# Patient Record
Sex: Female | Born: 1935 | Race: White | Hispanic: No | Marital: Married | Smoking: Never smoker
Health system: Southern US, Community
[De-identification: ages and names within clinical notes are randomized; demographics above are authoritative.]

## PROBLEM LIST (undated history)

## (undated) DIAGNOSIS — M810 Age-related osteoporosis without current pathological fracture: Secondary | ICD-10-CM

## (undated) DIAGNOSIS — E785 Hyperlipidemia, unspecified: Secondary | ICD-10-CM

## (undated) DIAGNOSIS — I519 Heart disease, unspecified: Secondary | ICD-10-CM

## (undated) DIAGNOSIS — I219 Acute myocardial infarction, unspecified: Secondary | ICD-10-CM

## (undated) DIAGNOSIS — F329 Major depressive disorder, single episode, unspecified: Secondary | ICD-10-CM

## (undated) DIAGNOSIS — F32A Depression, unspecified: Secondary | ICD-10-CM

## (undated) DIAGNOSIS — E039 Hypothyroidism, unspecified: Secondary | ICD-10-CM

## (undated) DIAGNOSIS — I10 Essential (primary) hypertension: Secondary | ICD-10-CM

## (undated) DIAGNOSIS — K559 Vascular disorder of intestine, unspecified: Secondary | ICD-10-CM

## (undated) DIAGNOSIS — N6019 Diffuse cystic mastopathy of unspecified breast: Secondary | ICD-10-CM

## (undated) DIAGNOSIS — T7840XA Allergy, unspecified, initial encounter: Secondary | ICD-10-CM

## (undated) HISTORY — DX: Allergy, unspecified, initial encounter: T78.40XA

## (undated) HISTORY — PX: BREAST BIOPSY: SHX20

## (undated) HISTORY — DX: Depression, unspecified: F32.A

## (undated) HISTORY — DX: Age-related osteoporosis without current pathological fracture: M81.0

## (undated) HISTORY — DX: Major depressive disorder, single episode, unspecified: F32.9

## (undated) HISTORY — PX: OTHER SURGICAL HISTORY: SHX169

## (undated) HISTORY — PX: BREAST SURGERY: SHX581

---

## 2004-05-28 ENCOUNTER — Ambulatory Visit: Payer: Self-pay | Admitting: Internal Medicine

## 2005-07-18 ENCOUNTER — Ambulatory Visit: Payer: Self-pay | Admitting: Internal Medicine

## 2006-07-29 ENCOUNTER — Ambulatory Visit: Payer: Self-pay | Admitting: Internal Medicine

## 2007-08-04 ENCOUNTER — Ambulatory Visit: Payer: Self-pay | Admitting: Internal Medicine

## 2008-08-04 ENCOUNTER — Ambulatory Visit: Payer: Self-pay | Admitting: Internal Medicine

## 2008-09-29 ENCOUNTER — Ambulatory Visit: Payer: Self-pay | Admitting: Gastroenterology

## 2009-05-06 DIAGNOSIS — K559 Vascular disorder of intestine, unspecified: Secondary | ICD-10-CM

## 2009-05-06 HISTORY — DX: Vascular disorder of intestine, unspecified: K55.9

## 2009-08-18 ENCOUNTER — Ambulatory Visit: Payer: Self-pay | Admitting: Internal Medicine

## 2011-02-15 ENCOUNTER — Ambulatory Visit: Payer: Self-pay | Admitting: Internal Medicine

## 2011-10-01 ENCOUNTER — Observation Stay: Payer: Self-pay | Admitting: Internal Medicine

## 2011-10-01 LAB — COMPREHENSIVE METABOLIC PANEL
Alkaline Phosphatase: 57 U/L (ref 50–136)
Calcium, Total: 8.8 mg/dL (ref 8.5–10.1)
Chloride: 102 mmol/L (ref 98–107)
EGFR (African American): 54 — ABNORMAL LOW
EGFR (Non-African Amer.): 47 — ABNORMAL LOW
Glucose: 109 mg/dL — ABNORMAL HIGH (ref 65–99)
Osmolality: 283 (ref 275–301)
Potassium: 3.8 mmol/L (ref 3.5–5.1)
Sodium: 140 mmol/L (ref 136–145)

## 2011-10-01 LAB — CBC
HGB: 12.7 g/dL (ref 12.0–16.0)
MCH: 31.8 pg (ref 26.0–34.0)
MCV: 95 fL (ref 80–100)
Platelet: 163 10*3/uL (ref 150–440)
RBC: 4 10*6/uL (ref 3.80–5.20)
RDW: 15 % — ABNORMAL HIGH (ref 11.5–14.5)
WBC: 4.8 10*3/uL (ref 3.6–11.0)

## 2011-10-01 LAB — TROPONIN I
Troponin-I: <0.02 ng/mL
Troponin-I: <0.02 ng/mL

## 2011-10-01 LAB — TSH: Thyroid Stimulating Horm: 5.83 u[IU]/mL — ABNORMAL HIGH

## 2011-10-02 LAB — CBC WITH DIFFERENTIAL/PLATELET
Basophil #: 0 10*3/uL (ref 0.0–0.1)
Eosinophil #: 0.1 10*3/uL (ref 0.0–0.7)
HCT: 34.2 % — ABNORMAL LOW (ref 35.0–47.0)
Lymphocyte %: 34.8 %
MCH: 31.9 pg (ref 26.0–34.0)
MCHC: 33.6 g/dL (ref 32.0–36.0)
MCV: 95 fL (ref 80–100)
Monocyte #: 0.5 x10 3/mm (ref 0.2–0.9)
Monocyte %: 9.7 %
Neutrophil #: 2.5 10*3/uL (ref 1.4–6.5)
Neutrophil %: 52.8 %
Platelet: 152 10*3/uL (ref 150–440)
RBC: 3.61 10*6/uL — ABNORMAL LOW (ref 3.80–5.20)
RDW: 14.8 % — ABNORMAL HIGH (ref 11.5–14.5)
WBC: 4.8 10*3/uL (ref 3.6–11.0)

## 2011-10-02 LAB — TROPONIN I: Troponin-I: <0.02 ng/mL

## 2011-10-02 LAB — LIPID PANEL
Cholesterol: 169 mg/dL (ref 0–200)
Triglycerides: 54 mg/dL (ref 0–200)

## 2011-10-02 LAB — CK-MB: CK-MB: 0.9 ng/mL (ref 0.5–3.6)

## 2011-10-02 LAB — MAGNESIUM: Magnesium: 2.2 mg/dL

## 2012-02-19 ENCOUNTER — Ambulatory Visit: Payer: Self-pay | Admitting: Internal Medicine

## 2013-02-19 ENCOUNTER — Ambulatory Visit: Payer: Self-pay | Admitting: Internal Medicine

## 2014-05-04 ENCOUNTER — Ambulatory Visit: Payer: Self-pay | Admitting: Internal Medicine

## 2014-05-17 ENCOUNTER — Ambulatory Visit: Payer: Self-pay | Admitting: Internal Medicine

## 2014-07-05 ENCOUNTER — Encounter: Admit: 2014-07-05 | Disposition: A | Discharge status: Home / Self Care | Payer: Self-pay

## 2014-08-28 NOTE — Discharge Summary (Signed)
PATIENT NAME:  Alyssa EpsteinSVEE, Ashlon I MR#:  [id number] DATE OF BIRTH:  02/05/1931  DATE OF ADMISSION:  10/01/2011 DATE OF DISCHARGE:  10/02/2011  DIAGNOSES AT TIME OF DISCHARGE: 1. Atypical chest pain, stress test negative.  2. Hypertension.  3. Hyperlipidemia.  4. Hypothyroidism.   CHIEF COMPLAINT: Chest pain.   HISTORY OF PRESENT ILLNESS: Alyssa EpsteinSally Christensen is a 81 year old female with a history of hypertension, hyperlipidemia, and hypothyroidism who presented to the ER complaining of chest pain that increased in intensity. The patient also described jaw pain. She states that she has had similar episodes in the past that were essentially noncardiac type pains.   PAST MEDICAL HISTORY: Significant for hypertension, hyperlipidemia, and hypothyroidism.   PAST SURGICAL HISTORY:  Significant for breast lumpectomy, varicose vein removal, and bunionectomy.   PHYSICAL EXAMINATION: VITAL SIGNS:  Pulse 71, temperature 97, respirations 18, blood pressure 155/70, oxygen saturation 96% on room air. GENERAL: She was alert and oriented and not in distress. HEART: S1, S2. LUNGS: Clear to auscultation. ABDOMEN: Soft, nontender.  EXTREMITIES: No edema. NEUROLOGIC: Nonfocal.   LABORATORY, DIAGNOSTIC, AND RADIOLOGICAL DATA: EKG shows sinus rhythm with PACs, possible left atrial enlargement. No ST elevation or depression noted. Cardiac enzymes were negative. The patient also underwent a cardiac stress test that was essentially normal. Troponin was negative. A1c was 5.8. Magnesium was 2.2. During her stay in the hospital, the patient remained stable. She did not have any further episodes and was reassured that her stress test was essentially negative. She was discharged in stable condition on the following medications.    DISCHARGE MEDICATIONS:  1. Aspirin 81 mg a day.  2. Levothyroxine 25 mcg a day.  3. Metoprolol tartrate 50 mg b.i.d.  4. Multivitamin 1 tablet a day.  5. Simvastatin 20 mg once a day that she takes  on  Mondays, Wednesdays, and Fridays. 6. Losartan 25 mg once a day.   FOLLOWUP: The patient is advised a low-sodium diet and followup with me, Dr. Marcello FennelHande, in 1 to 2 weeks' time    ____________________________ Barbette ReichmannVishwanath Remberto Lienhard, MD vh:bjt D: 10/04/2011 16:54:57 ET T: 10/05/2011 12:25:14 ET JOB#: [id number]  cc: Barbette ReichmannVishwanath Marlos Carmen, MD, <Dictator> Barbette ReichmannVISHWANATH Janeene Sand MD ELECTRONICALLY SIGNED 10/17/2011 13:01

## 2014-08-28 NOTE — H&P (Signed)
PATIENT NAME:  Alyssa Christensen, Alyssa Christensen I MR#:  [id number] DATE OF BIRTH:  08-05-1930  DATE OF ADMISSION:  10/01/2011  REFERRING PHYSICIAN: Dr. Mayford Knife  PRIMARY CARE PHYSICIAN: Barbette Reichmann, MD  CHIEF COMPLAINT: Chest pain.  HISTORY OF PRESENT ILLNESS: The patient is a 81 year old Caucasian female with history of hypertension, hyperlipidemia, and hypothyroidism who presents with the above chief complaint. The patient states that she has had ongoing chest pain for multiple years, over a decade. More recently the chest pain has increased in intensity and has been bothering her more often. The pain is on the left side of the chest under the breast with usually no radiation. It lasts seconds. It often recurs in minutes. She could have several episodes of this daily without any associated symptoms of nausea, vomiting, diaphoresis, or radiation. Overnight there was a change in this as the chest pain was felt radiating to the jaw and she also complained of some jaw pressure.  Besides that is she had no other symptoms. She went back to sleep and woke up this morning and again had similar pain and presented to the hospital. Currently she is chest pain free. She has negative troponin and the hospitalist service was contacted for further evaluation and management.   PAST MEDICAL HISTORY:  1. Hypertension.  2. Hyperlipidemia.  3. Hypothyroidism.   PAST SURGICAL HISTORY:  1. Bunionectomy. 2. Varicose vein removal. 3. Breast lumpectomies.   ALLERGIES: Codeine, Valium, and shellfish is listed, however, she denies having any GI distress any further with shellfish. Penicillin causes thrombocytopenia. Lisinopril causes cough.   FAMILY HISTORY: Bone cancers in several first degree relatives, also family history of hypertension.   SOCIAL HISTORY: No tobacco use history. She occasionally drinks alcohol. No other drug use.   REVIEW OF SYSTEMS: CONSTITUTIONAL: No fever, fatigue or weakness. EYES: No blurry vision or  double vision. ENT: No tinnitus or hearing loss. No snoring. RESPIRATORY: The patient has a dry cough after starting lisinopril. No wheezing. She has chronic dyspnea on exertion. No asthma or painful respiration. CARDIOVASCULAR: Chest pain as above. No orthopnea. Occasional lower extremity edema after prolonged standing. No arrhythmia or palpitations. GI: No nausea, vomiting, diarrhea, abdominal pain, or dark stools. GENITOURINARY: The patient complains of bladder prolapse and some increased frequency with that. No dysuria. ENDOCRINE: No polyuria or nocturia. Hypothyroid state. HEME/LYMPH: No anemia or easy bruising. SKIN: No rashes. MUSCULOSKELETAL: No gout. NEURO: Denies history of cerebrovascular accident, transient ischemic attack, numbness or weakness. PSYCHIATRIC: Denies anxiety or insomnia.   PHYSICAL EXAMINATION:   VITAL SIGNS: Temperature on arrival 97, pulse rate 71, respiratory rate 18, blood pressure on arrival 155/70 and last one 121/54, and oxygen saturation 96% on room air.   GENERAL: The patient is an awake, alert, and oriented Caucasian female in no obvious distress, sitting in bed, talking in full sentences.   HEENT: Normocephalic, atraumatic. Pupils are equal and reactive. Anicteric sclerae. Moist mucous membranes.   NECK: Supple. No thyroid tenderness. No JVD.   HEART: S1 and S2 regular rate and rhythm. No murmurs, rubs, or gallops appreciated.   LUNGS: Clear to auscultation without wheezing, rhonchi, or rales.   ABDOMEN: Soft, nontender, and nondistended. Positive bowel sounds in all quadrants. No organomegaly noted.   EXTREMITIES: No significant lower extremity edema.   SKIN: There are some varicose veins in the lower extremities, otherwise warm. No clubbing or cyanosis.  NEURO: Cranial nerves II through XII grossly intact. Strength 5/5 in all extremities. Sensation is intact to light touch.  PSYCH: Awake, alert, and oriented x3. Pleasant and  cooperative.  LABS/STUDIES: BNP 291. Glucose 109, BUN 22, creatinine 1.12, and calcium 8.1. LFTs within normal limits. Troponin negative x1. WBC 4.8, hemoglobin 12.7, hematocrit 38.1, and platelets 163.   EKG: Sinus rhythm with premature atrial complex, possible left atrial enlargement, rate is 74. No QRS, QTc, or PR interval prolongation. No acute ST elevations or depressions. I do not appreciate any T wave inversions. There is some flattening of T waves in III.  Chest x-ray, one view: No acute changes are identified.   ASSESSMENT AND PLAN: We have a 81 year old Caucasian female with hypertension, hyperlipidemia, and hypothyroidism with ongoing chest pain with mostly atypical features with progressive for chest pain more recently.   In regards to her chest pain, we would admit the patient to the hospital for observation. The patient has mostly atypical symptoms, but with ongoing nature and more recent progression, given age and comorbidities, would rule out acute coronary syndrome as a possibility. The patient has unstable angina. We would resume the aspirin and the beta blocker and add nitro paste. I would check an echocardiogram and if the patient rules out for acute coronary syndrome order a stress test in the morning. We would trend the troponins as well as CK-MB. There is no acute ST changes on the EKG. I would check lipids and place the patient on remote telemetry.  In regards to high blood pressure, we would continue the beta blocker; however, I would discontinue the lisinopril as the patient states she developed a dry hacking cough after initiation of this medicine. I would start losartan, low dose, and this could be titrated up as needed.  In regards to hypothyroidism, we would check a TSH and resume the Synthroid.   CODE STATUS: FULL CODE.   TOTAL TIME SPENT: 40 minutes. ____________________________ Krystal EatonShayiq Vardaan Depascale, MD sa:slb D: 10/01/2011 12:36:51 ET T: 10/01/2011 13:06:01  ET JOB#: [id number]  cc: Krystal EatonShayiq Trichelle Lehan, MD, <Dictator> Barbette ReichmannVishwanath Hande, MD Krystal EatonSHAYIQ Valdez Brannan MD ELECTRONICALLY SIGNED 10/11/2011 12:38

## 2015-01-26 ENCOUNTER — Other Ambulatory Visit: Payer: Self-pay | Admitting: Obstetrics and Gynecology

## 2015-01-26 DIAGNOSIS — N6001 Solitary cyst of right breast: Secondary | ICD-10-CM

## 2015-02-01 ENCOUNTER — Ambulatory Visit: Payer: Medicare Other | Attending: Obstetrics and Gynecology | Admitting: Physical Therapy

## 2015-02-01 ENCOUNTER — Encounter: Payer: Self-pay | Admitting: Physical Therapy

## 2015-02-01 VITALS — BP 110/68

## 2015-02-01 DIAGNOSIS — R279 Unspecified lack of coordination: Secondary | ICD-10-CM | POA: Insufficient documentation

## 2015-02-01 DIAGNOSIS — R269 Unspecified abnormalities of gait and mobility: Secondary | ICD-10-CM | POA: Diagnosis present

## 2015-02-02 ENCOUNTER — Other Ambulatory Visit: Payer: Medicare Other

## 2015-02-02 ENCOUNTER — Ambulatory Visit: Payer: Medicare Other

## 2015-02-02 NOTE — Therapy (Signed)
[facility] MAIN [facility] SERVICES [address] [place], [place], [address] Phone: [phone]   Fax:  [phone]  Physical Therapy Evaluation  Patient Details  Name: Alyssa Christensen MRN: [id number] Date of Birth: 12/04/1930 Referring Provider:  Christeen Douglas, MD  Encounter Date: 02/01/2015      PT End of Session - 02/02/15 1030    Visit Number 1   Number of Visits 12   Date for PT Re-Evaluation 04/26/15   Authorization Type G-code 10th visit   PT Start Time 1005   PT Stop Time 1110   PT Time Calculation (min) 65 min   Activity Tolerance Patient tolerated treatment well;No increased pain   Behavior During Therapy [facility] for tasks assessed/performed      Past Medical History  Diagnosis Date  . Depression   . CHF (congestive heart failure)   . Osteoporosis   . Allergy     Past Surgical History  Procedure Laterality Date  . Breast surgery      "to remove nodules that turned out to be benign"  . Foot surgeries      bilaterally to remove bunions  . Varicose vein "stripped"      pt wears compression stocking now    Filed Vitals:   02/01/15 1027  BP: 110/68    Visit Diagnosis:  Lack of coordination  Abnormality of gait      Subjective Assessment - 02/02/15 0941    Subjective Pt reports her bladder dropped for atleast a decade and now it has become "annoying" to her. Pt has to "put it back in" after urination. Pt experience leakage when pt has waited too long to go to urination, or not have felt the urge. Pt wears a pantyliner as a result, and a thicker pad for the night.  Bowel movements occur daily without straining although pt admits she had strained over the years.  Pt reported she enjoys walking 2 miles/ day  but she has not  resumed her regular routine due to caring for her terminally ill husban (pt appeared tearful). Pt also feels she has not  tended to her health and would like to lose weight.    Pertinent History Hx of 4  vaginal deliveries with episiotomies, bunion surgeries, CHF   Currently in Pain? No/denies            [facility] PT Assessment - 02/02/15 0945    Assessment   Medical Diagnosis pelvic pain   Precautions   Precautions None   Restrictions   Weight Bearing Restrictions No   Home Environment   Living Environment Private residence   Prior Function   Level of Independence Independent   Observation/Other Assessments   Other Surveys  --  PFDI 39%    Coordination   Gross Motor Movements are Fluid and Coordinated --  abdominal straining w/ inhalation   Bed Mobility   Supine to Sit --  crunch technique: cued for log roll    Ambulation/Gait   Gait Comments  lateral trunk lean w/ L SLS, decreased stance on R                  Pelvic Floor Special Questions - 02/02/15 1013    Diastasis Recti below sternum 2 fingers, above umbilicus 1.5 fingers   Prolapse Anterior Wall  visible within introitus in hooklying   Pelvic Floor Internal Exam --  pt verbally consented without contraindications   Exam Type Vaginal   Palpation pillow under hips:  bladder not visible    Strength --  deferred testing strength due to poor coordination   Biofeedback able to coordinate pelvic floor and breathing with moderate cuing (decreased abdominal straining, chest breathing)           [facility] Adult PT Treatment/Exercise - 17-Feb-2015 0945    Posture/Postural Control   Posture Comments limited diaphragmatic excursion and depression   Self-Care   Other Self-Care Comments  educated on pastoral care/counselling for stressful times 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 but pt declined, educated about POC, goals, deep core mechanics    Neuro Re-ed    Neuro Re-ed Details  proper deep core coordination with bowel movements, less effort, decreased chest breathing  log rolling                PT Education - 17-Feb-2015 1030    Education provided Yes   Education Details HEP,POC, Goals,  anatomy/physiology, ways to decrease downward pressure on pelvic floor / abdominal mm   Person(s) Educated Patient   Methods Explanation;Demonstration;Tactile cues;Verbal cues;Handout   Comprehension Verbalized understanding;Returned demonstration             PT Long Term Goals - 17-Feb-2015 1035    PT LONG TERM GOAL #1   Title Pt will decrease her PFDI score from 39% to < 30% in order to demo improved urogenital function and improve QOL.   Time 12   Period Weeks   Status New   PT LONG TERM GOAL #2   Title Pt will demo proper diaphragmatic breathing with excursion on inhalation (less abdominal straining) and ribcage depression on exhalation in order to progress towards pelvic floor strengthening programs.    Time 12   Period Weeks   Status New   PT LONG TERM GOAL #3   Title Pt will demo decreased lateral trunk sway on L STS in order to improve gait and decrease risk for falls.    Time 12   Period Weeks   Status New               Plan - 02/17/2015 1032    Clinical Impression Statement Pt is an 81 yo old female whose S & Sx are consistent with abnormal positioning of bladder, poor coordination of diaphragm, abdominals, and pelvic floor, and poor body mechanics that poses downward pressure on pelvic floor, and gait deviations. These deficits affect her gait and QOL.Marland Kitchen    Pt will benefit from skilled therapeutic intervention in order to improve on the following deficits Abnormal gait;Decreased activity tolerance;Decreased balance;Decreased mobility;Decreased strength;Impaired sensation;Postural dysfunction;Improper body mechanics;Impaired flexibility;Decreased scar mobility;Impaired perceived functional ability;Pain;Decreased endurance;Decreased coordination;Decreased range of motion;Increased fascial restrictions;Difficulty walking;Decreased safety awareness;Increased muscle spasms   Rehab Potential Good   PT Frequency 1x / week   PT Duration 12 weeks   PT Treatment/Interventions  ADLs/Self Care Home Management;Moist Heat;Cryotherapy;Electrical Stimulation;Iontophoresis /ml Dexamethasone;Aquatic Therapy;Biofeedback;Traction;Balance training;Neuromuscular re-education;Patient/family education;Therapeutic activities;Therapeutic exercise;Functional mobility training;Stair training;Gait training;Taping;Energy conservation;Dry needling;Scar mobilization;Manual techniques;Compression bandaging   PT Next Visit Plan assess feet, balance   Consulted and Agree with Plan of Care Patient          G-Codes - 02/17/2015 1043    Functional Assessment Tool Used PFDI 39%   Functional Limitation Self care   Self Care Current Status (Z6109) At least 20 percent but less than 40 percent impaired, limited or restricted   Self Care Goal Status (U0454) At least 1 percent but less than 20 percent impaired, limited or restricted  Problem List There are no active problems to display for this patient.   Mariane Masters  ,PT, DPT, E-RYT  02/02/2015, 10:46 AM  [facility] MAIN [facility] SERVICES [address] [place], [place], [address] Phone: [phone]   Fax:  [phone]

## 2015-02-02 NOTE — Patient Instructions (Signed)
                                               Preserve the function of your pelvic floor, abdomen, and back.              Avoid decreased straining of abdominal/pelvic floor muscles with less              slouching,  holding your breath with lifting/bowel movements)                                                     FUNCTIONAL POSTURES        

## 2015-02-03 ENCOUNTER — Ambulatory Visit
Admission: RE | Admit: 2015-02-03 | Discharge: 2015-02-03 | Disposition: A | Discharge status: Home / Self Care | Payer: Medicare Other | Source: Ambulatory Visit | Attending: Obstetrics and Gynecology | Admitting: Obstetrics and Gynecology

## 2015-02-03 ENCOUNTER — Other Ambulatory Visit: Payer: Self-pay | Admitting: Obstetrics and Gynecology

## 2015-02-03 DIAGNOSIS — N6001 Solitary cyst of right breast: Secondary | ICD-10-CM

## 2015-02-06 ENCOUNTER — Ambulatory Visit: Payer: Medicare Other | Attending: Obstetrics and Gynecology | Admitting: Physical Therapy

## 2015-02-06 ENCOUNTER — Other Ambulatory Visit: Payer: Self-pay | Admitting: Obstetrics and Gynecology

## 2015-02-06 DIAGNOSIS — R269 Unspecified abnormalities of gait and mobility: Secondary | ICD-10-CM | POA: Diagnosis present

## 2015-02-06 DIAGNOSIS — R279 Unspecified lack of coordination: Secondary | ICD-10-CM | POA: Insufficient documentation

## 2015-02-06 DIAGNOSIS — N6310 Unspecified lump in the right breast, unspecified quadrant: Secondary | ICD-10-CM

## 2015-02-06 NOTE — Therapy (Signed)
[facility] MAIN [facility] SERVICES [address] [place], [place], [address] Phone: [phone]   Fax:  [phone]  Physical Therapy Treatment  Patient Details  Name: Alyssa Christensen MRN: [id number] Date of Birth: August 22, 1930 Referring Provider:  Christeen Douglas, MD  Encounter Date: 02/06/2015      PT End of Session - 02/06/15 1424    Visit Number 2   Number of Visits 12   Date for PT Re-Evaluation 04/26/15   Authorization Type G-code 10th visit   PT Start Time 1305   PT Stop Time 1407   PT Time Calculation (min) 62 min   Activity Tolerance Patient tolerated treatment well;No increased pain   Behavior During Therapy [facility] for tasks assessed/performed      Past Medical History  Diagnosis Date  . Depression   . CHF (congestive heart failure)   . Osteoporosis   . Allergy     Past Surgical History  Procedure Laterality Date  . Breast surgery      "to remove nodules that turned out to be benign"  . Foot surgeries      bilaterally to remove bunions  . Varicose vein "stripped"      pt wears compression stocking now  . Breast biopsy Bilateral     neg    There were no vitals filed for this visit.  Visit Diagnosis:  Lack of coordination  Abnormality of gait      Subjective Assessment - 02/06/15 1308    Subjective Pt inquired about performing sit-ups and leg raises, stating she used to do them for 25 years to keep her weight down and to get a flatter belly. Pt practiced HEP.    Pertinent History Hx of 4 vaginal deliveries with episiotomies, bunion surgeries, CHF            [facility] PT Assessment - 02/06/15 1321    Posture/Postural Control   Posture Comments improved diaphragmatic excursion and depression of ribcage   Bed Mobility   Supine to Sit --  cued for log rolling sit > sidelying                   Pelvic Floor Special Questions - 02/06/15 1320    Diastasis Recti below sternum  no finger, above umbilicus 1  fingers   Exam Type Vaginal   Palpation pillow under hips: bladder not visible    Strength fair squeeze, definite lift   Strength # of reps 5   Strength # of seconds 5   Biofeedback cuing for decreased abdominal straining downward on pelvic floor on inhalation and for applying decreased effort with contractions to avoid use of adductor mm           [facility] Adult PT Treatment/Exercise - 02/06/15 1321    Ambulation/Gait   Gait Comments 0.88 m/s   toe-in bilaterally   Self-Care   Other Self-Care Comments  educated to avoid sit -ups/leg raises and implications on worsening prolapse, explained the progression of deep core strengthening towards functional core stability such floor to rise which she stated she has difficulty    Neuro Re-ed    Neuro Re-ed Details  dynamic stabilization 1-2, cuing for pelvic stability                PT Education - 02/06/15 1424    Education provided Yes   Education Details HEP   Person(s) Educated Patient   Methods Explanation;Demonstration;Tactile cues;Verbal cues;Handout   Comprehension Returned demonstration;Verbalized understanding  PT Long Term Goals - 02/06/15 1311    PT LONG TERM GOAL #1   Title Pt will decrease her PFDI score from 39% to < 30% in order to demo improved urogenital function and improve QOL.   Time 12   Period Weeks   Status New   PT LONG TERM GOAL #2   Title Pt will demo proper diaphragmatic breathing with excursion on inhalation (less abdominal straining) and ribcage depression on exhalation in order to progress towards pelvic floor strengthening programs.    Time 12   Period Weeks   Status New   PT LONG TERM GOAL #3   Title Pt will demo decreased lateral trunk sway on L SLS in order to improve gait and decrease risk for falls.    Time 12   Period Weeks   Status New   PT LONG TERM GOAL #4   Title Pt will improve her gait speed from 0.88 to > 1.2m/s in order to decrease her risk for falls and to  ambulate in the community.    Time 12   Period Weeks   Status New               Plan - 02/06/15 1426    Clinical Impression Statement Pt demo'd significant improvement with diaphragmatic/ pelvic floor coordination with minor cuing for abominal straining during pelvic floor strengthening exercises and deep core strengthening Level 1-2. Pt presented today with improved DRA. Assess LE at next session.    Pt will benefit from skilled therapeutic intervention in order to improve on the following deficits Abnormal gait;Decreased activity tolerance;Decreased balance;Decreased mobility;Decreased strength;Impaired sensation;Postural dysfunction;Improper body mechanics;Impaired flexibility;Decreased scar mobility;Impaired perceived functional ability;Pain;Decreased endurance;Decreased coordination;Decreased range of motion;Increased fascial restricitons;Difficulty walking;Decreased safety awareness;Increased muscle spasms   Rehab Potential Good   PT Frequency 1x / week   PT Duration 12 weeks   PT Treatment/Interventions ADLs/Self Care Home Management;Moist Heat;Cryotherapy;Electrical Stimulation;Iontophoresis /ml Dexamethasone;Aquatic Therapy;Biofeedback;Traction;Balance training;Neuromuscular re-education;Patient/family education;Therapeutic activities;Therapeutic exercise;Functional mobility training;Stair training;Gait training;Taping;Energy conservation;Dry needling;Scar mobilization;Manual techniques;Compression bandaging   PT Next Visit Plan assess feet, balance   Consulted and Agree with Plan of Care Patient        Problem List There are no active problems to display for this patient.   Mariane Masters ,PT, DPT, E-RYT  02/06/2015, 2:33 PM  [place] [facility] MAIN [facility] SERVICES [address] [place], [place], [address] Phone: [phone]   Fax:  [phone]

## 2015-02-06 NOTE — Patient Instructions (Addendum)
  Avoid crunches and leg lifts.     PELVIC FLOOR / KEGEL EXERCISES   Pelvic floor/ Kegel exercises are used to strengthen the muscles in the base of your pelvis that are responsible for supporting your pelvic organs and preventing urine/feces leakage. Based on your therapist's recommendations, they can be performed while standing, sitting, or lying down. Imagine pelvic floor area as a diamond with pelvic landmarks: top =pubic bone, bottom tip=tailbone, sides=sitting bones (ischial tuberosities).    Make yourself aware of this muscle group by using these cues while coordinating your breath:  Inhale, feel pelvic floor diamond area lower like hammock towards your feet and ribcage/belly expanding. Pause. Let the exhale naturally and feel your belly sink, abdominal muscles hugging in around you and you may notice the pelvic diamond draws upward towards your head forming a umbrella shape. Give a squeeze during the exhalation like you are stopping the flow of urine. If you are squeezing the buttock muscles, try to give 50% less effort.   Common Errors:  Breath holding: If you are holding your breath, you may be bearing down against your bladder instead of pulling it up. If you belly bulges up while you are squeezing, you are holding your breath. Be sure to breathe gently in and out while exercising. Counting out loud may help you avoid holding your breath.  Accessory muscle use: You should not see or feel other muscle movement when performing pelvic floor exercises. When done properly, no one can tell that you are performing the exercises. Keep the buttocks, belly and inner thighs relaxed.  Overdoing it: Your muscles can fatigue and stop working for you if you over-exercise. You may actually leak more or feel soreness at the lower abdomen or rectum.  YOUR HOME EXERCISE PROGRAM  LONG HOLDS: Position: on back with pillow under hips  Inhale and then exhale. Then squeeze the muscle and count aloud for  5 seconds. Rest with three long breaths. (Be sure to let belly sink in with exhales and not push outward)  Perform 5 repetitions, 3 times/day  SHORT HOLDS: Position: on back, with pillow under hips  Inhale and then exhale. Then squeeze the muscle.  (Be sure to let belly sink in with exhales and not push outward)  Perform 5 repetitions, 5  Times/day                      DECREASE DOWNWARD PRESSURE ON  YOUR PELVIC FLOOR, ABDOMINAL, LOW BACK MUSCLES       PRESERVE YOUR PELVIC HEALTH LONG-TERM   ** SQUEEZE pelvic floor BEFORE YOUR SNEEZE, COUGH, LAUGH   ** EXHALE BEFORE YOU RISE AGAINST GRAVITY (lifting, sit to stand, from squat to stand)   ** LOG ROLL OUT OF BED INSTEAD OF CRUNCH/SIT-UP

## 2015-02-14 ENCOUNTER — Ambulatory Visit: Payer: Medicare Other | Admitting: Physical Therapy

## 2015-02-14 DIAGNOSIS — R279 Unspecified lack of coordination: Secondary | ICD-10-CM | POA: Diagnosis not present

## 2015-02-14 DIAGNOSIS — R269 Unspecified abnormalities of gait and mobility: Secondary | ICD-10-CM

## 2015-02-15 NOTE — Therapy (Signed)
[facility]IN [facility] [address] [place], [place], [address] Phone: [phone]   Fax:  [phone]  Physical Therapy Treatment  Patient Details  Name: Alyssa HintSally I Christensen MRN: [id number] Date of Birth: 1930-12-02 Referring Provider:  Christeen DouglasBeasley, Bethany, MD  Encounter Date: 02/14/2015      PT End of Session - 02/15/15 2144    Visit Number 3   Number of Visits 12   Date for PT Re-Evaluation 04/26/15   Authorization Type G-code 10th visit   PT Start Time 1430   PT Stop Time 1530   PT Time Calculation (min) 60 min   Activity Tolerance Patient tolerated treatment well;No increased pain   Behavior [facility] for tasks assessed/performed      Past Medical History  Diagnosis Date  . Depression   . CHF (congestive heart failure)   . Osteoporosis   . Allergy     Past Surgical History  Procedure Laterality Date  . Breast surgery      "to remove nodules that turned out to be benign"  . Foot surgeries      bilaterally to remove bunions  . Varicose vein "stripped"      pt wears compression stocking now  . Breast biopsy Bilateral     neg    There were no vitals filed for this visit.  Visit Diagnosis:  Lack of coordination  Abnormality of gait      Subjective Assessment - 02/15/15 2141    Subjective Pt has been doing her HEP but not as much she'd lke. Pt returned to walking with her friends. Pt continues to be busy with taking her husband to medical appointments.    Pertinent History Hx of 4 vaginal deliveries with episiotomies, bunion surgeries, CHF                      Pelvic Floor Special Questions - 02/15/15 2141    Exam Type Vaginal   Palpation pillow under hips: bladder not visible    Strength fair squeeze, definite lift   Strength # of reps 5   Strength # of seconds 5   Biofeedback coordination was correct but required cuing for counting aloud and the importance of keeping count on reps and rest  breaks            [facility] Adult PT Treatment/Exercise - 02/15/15 0001    Therapeutic Activites    ADL's sweeping, vaccuuming with side stepping and semi tandem weight shifting, pt demo'd correctly with tools in hand   Lifting 10 reps of mini squats by counter, 15# box with proper mechanics, 5 reps with 1 rep carrying 30 ft  required alignment cuing and no breathholding                PT Education - 02/15/15 2144    Education provided Yes   Education Details HEP   Person(s) Educated Patient   Methods Explanation;Demonstration;Tactile cues;Verbal cues   Comprehension Returned demonstration;Verbalized understanding             PT Long Term Goals - 02/06/15 1311    PT LONG TERM GOAL #1   Title Pt will decrease her PFDI score from 39% to < 30% in order to demo improved urogenital function and improve QOL.   Time 12   Period Weeks   Status New   PT LONG TERM GOAL #2   Title Pt will demo proper diaphragmatic breathing with excursion on inhalation (  less abdominal straining) and ribcage depression on exhalation in order to progress towards pelvic floor strengthening programs.    Time 12   Period Weeks   Status New   PT LONG TERM GOAL #3   Title Pt will demo decreased lateral trunk sway on L SLS in order to improve gait and decrease risk for falls.    Time 12   Period Weeks   Status New   PT LONG TERM GOAL #4   Title Pt will improve her gait speed from 0.88 to > 1.89m/s in order to decrease her risk for falls and to ambulate in the community.    Time 12   Period Weeks   Status New               Plan - 02/15/15 2155    Clinical Impression Statement Pt is progressing well towards her goals with demonstration of proper pelvic floor contraction with less chest breathing and use of accessory mm.Initiated functional exercises to increase compliance with H.  Pt will skip next week's session due to family being in town. Pt was educated on maintaining HEP.  EP    Pt  will benefit from skilled therapeutic intervention in order to improve on the following deficits Abnormal gait;Decreased activity tolerance;Decreased balance;Decreased mobility;Decreased strength;Impaired sensation;Postural dysfunction;Improper body mechanics;Impaired flexibility;Decreased scar mobility;Impaired perceived functional ability;Pain;Decreased endurance;Decreased coordination;Decreased range of motion;Increased fascial restricitons;Difficulty walking;Decreased safety awareness;Increased muscle spasms   Rehab Potential Good   PT Frequency 1x / week   PT Duration 12 weeks   PT Treatment/Interventions ADLs/Self Care Home Management;Moist Heat;Cryotherapy;Electrical Stimulation;Iontophoresis /ml Dexamethasone;Aquatic Therapy;Biofeedback;Traction;Balance training;Neuromuscular re-education;Patient/family education;Therapeutic activities;Therapeutic exercise;Functional mobility training;Stair training;Gait training;Taping;Energy conservation;Dry needling;Scar mobilization;Manual techniques;Compression bandaging   PT Next Visit Plan assess feet, balance   Consulted and Agree with Plan of Care Patient        Problem List There are no active problems to display for this patient.   Mariane Masters  ,PT, DPT, E-RYT  02/15/2015, 10:00 PM  [facility] MAIN [facility] SERVICES [address] [place], [place], [address] Phone: [phone]   Fax:  [phone]

## 2015-02-28 ENCOUNTER — Encounter: Payer: BLUE CROSS/BLUE SHIELD | Admitting: Physical Therapy

## 2015-03-02 ENCOUNTER — Ambulatory Visit: Payer: Medicare Other | Admitting: Physical Therapy

## 2015-03-02 DIAGNOSIS — R269 Unspecified abnormalities of gait and mobility: Secondary | ICD-10-CM

## 2015-03-02 DIAGNOSIS — R279 Unspecified lack of coordination: Secondary | ICD-10-CM

## 2015-03-02 NOTE — Patient Instructions (Addendum)
Exhale when rising from chair   Exhale with counter push ups with feet hip width apart and sway of the back   Pelvic floor 10 sec holds, 5 reps, 5 x day for 2 weeks, then progress to 7 reps for another 2 weeks...  Diaphragmatic breathing in standing (less chest breathing), feel the upward life of pelvic floor muscles and hugging of belly muscles on exhale   Follow -up with Dr. Dalbert GarnetBeasley with questions about pessary use and PT for progression of exercises   Toileting posture with feet flat on stool not with heels raised

## 2015-03-03 NOTE — Therapy (Signed)
[place] MAIN [facility] SERVICES [address] [place], [place], [address] Phone: [phone]   Fax:  [phone]  Physical Therapy Treatment [place] Summary  Patient Details  Name: Alyssa Christensen MRN: [id number] Date of Birth: [birth year] Referring Provider: Leafy Ro  Encounter Date: 03/02/2015      PT End of Session - 03/02/15 1456    Visit Number 4   Number of Visits 12   Date for PT Re-Evaluation 04/26/15   Authorization Type G-code 10th visit   PT Start Time 5102   PT Stop Time 1410   PT Time Calculation (min) 65 min   Activity Tolerance Patient tolerated treatment well;No increased pain   Behavior During Therapy [facility] for tasks assessed/performed      Past Medical History  Diagnosis Date  . Depression   . CHF (congestive heart failure)   . Osteoporosis   . Allergy     Past Surgical History  Procedure Laterality Date  . Breast surgery      "to remove nodules that turned out to be benign"  . Foot surgeries      bilaterally to remove bunions  . Varicose vein "stripped"      pt wears compression stocking now  . Breast biopsy Bilateral     neg    There were no vitals filed for this visit.  Visit Diagnosis:  Lack of coordination  Abnormality of gait      Subjective Assessment - 03/02/15 1308    Subjective Pt was not able to perform as much as she would have liked. Pt feels she is getting busy with her schedule and feels she know she needs to practice the exercises. Pt would like to return to previous self selected  exercises . Bowel movements are much easier but pt reported she has her heels off the floor and leans herself back .    Pertinent History Hx of 4 vaginal deliveries with episiotomies, bunion surgeries, CHF   Currently in Pain? No/denies            [facility] PT Assessment - 03/03/15 1858    Assessment   Medical Diagnosis pelvic pain   Referring Provider Leafy Ro   Observation/Other Assessments   Other Surveys   --  PFDI 42%   Sit to Stand   Comments breath holding, slight genu valgus                  Pelvic Floor Special Questions - 03/03/15 1859    Diastasis Recti no separation noted    Prolapse --  no longer visible but visible in standing   Pelvic Floor Internal Exam --  pt verbally consented without contraindications   Exam Type Vaginal   Palpation no longer pillow needed   Strength fair squeeze, definite lift   Strength # of reps 5   Strength # of seconds 10   Biofeedback coordination was correct but required cuing for counting aloud and the importance of keeping count on reps and rest breaks            [facility] Adult PT Treatment/Exercise - 03/03/15 1858    Ambulation/Gait   Gait Comments 1.11 m/s   Self-Care   Other Self-Care Comments  D/C, encouragement on her progress thus far, ff/u with Leafy Ro on pessary questions.    Therapeutic Activites    Therapeutic Activities --  reassessed goals   Neuro Re-ed    Neuro Re-ed Details  dynamic stabilization 3-4  with correct form, sit  to stand with exhale, plantigrade pushup on counter simulated like her kitchen countertop (proper alignment and deep core activation to decrease lumbar lordosis), proper toileting posture with feet flat on stool not with heels raised   see pt instructions                PT Education - 03/03/15 1900    Education provided Yes   Education Details HEP, D/C wellness and follow -up with Zannie Kehr) Educated Patient   Methods Explanation;Demonstration;Tactile cues;Verbal cues;Handout   Comprehension Verbalized understanding;Returned demonstration             PT Long Term Goals - 03/03/15 1901    PT LONG TERM GOAL #1   Title Pt will decrease her PFDI score from 39% to < 30% in order to demo improved urogenital function and improve QOL.   Time 12   Period Weeks   Status Not Met   PT LONG TERM GOAL #2   Title Pt will demo proper diaphragmatic breathing with excursion on  inhalation (less abdominal straining) and ribcage depression on exhalation in order to progress towards pelvic floor strengthening programs.    Time 12   Period Weeks   Status Achieved   PT LONG TERM GOAL #3   Title Pt will demo decreased lateral trunk sway on L SLS in order to improve gait and decrease risk for falls.    Time 12   Period Weeks   Status Achieved   PT LONG TERM GOAL #4   Title Pt will improve her gait speed from 0.88 to > 1.3ms in order to decrease her risk for falls and to ambulate in the community.  (03/02/15: 1/11 m/s)    Time 12   Period Weeks   Status Achieved               Plan - 03/03/15 1901    Clinical Impression Statement Pt met 3/4 goals despite pt's report of inconsistent compliance to HEP. Pt demo'd increased gait speed, imrpoved balance, and lumbopelvic stability. Pt also demo'd decreased abdominal separation (diastasis recti), improved motor control and movement patterns that minimized downward forces on the pelvic floor, and stronger pelvic floor strength/endurance with more cephalad position of bladder in hooklying position but not in upright position. Pt expressed she would like to self-d/c. Pt expressed understanding the need be more consisentt with her HEP and feeling the need to practice on her own whle caring for her husband. Pt was tearful again during session.  Pt was recommended to follow-up with MD on the benefit and risks with pessary use. PT suggested that a pessary may be helpful during upright activities until pt has had a prolonged practiced with pelvic floor strengthening in gravity eliminated position (hooklying) to further increase pelvic floor strength. Pt voiced uncertainties with pessary use but was going to give it some thought. Pt is ready for d/c. Pt was pleasant to work with; thank you for your referral!     Pt will benefit from skilled therapeutic intervention in order to improve on the following deficits Abnormal gait;Decreased  activity tolerance;Decreased balance;Decreased mobility;Decreased strength;Impaired sensation;Postural dysfunction;Improper body mechanics;Impaired flexibility;Decreased scar mobility;Impaired perceived functional ability;Pain;Decreased endurance;Decreased coordination;Decreased range of motion;Increased fascial restricitons;Difficulty walking;Decreased safety awareness;Increased muscle spasms   Rehab Potential Good   PT Frequency 1x / week   PT Duration 12 weeks   PT Treatment/Interventions ADLs/Self Care Home Management;Moist Heat;Cryotherapy;Electrical Stimulation;Iontophoresis 480mml Dexamethasone;Aquatic Therapy;Biofeedback;Traction;Balance training;Neuromuscular re-education;Patient/family education;Therapeutic activities;Therapeutic exercise;Functional mobility training;Stair training;Gait training;Taping;Energy conservation;Dry needling;Scar  mobilization;Manual techniques;Compression bandaging   PT Next Visit Plan --   Consulted and Agree with Plan of Care Patient          G-Codes - 2015/03/08 1911    Functional Assessment Tool Used clinical judgement   Functional Limitation Self care   Self Care Current Status [phone]) At least 20 percent but less than 40 percent impaired, limited or restricted   Self Care Goal Status ([id number]) At least 1 percent but less than 20 percent impaired, limited or restricted      Self Care Discharge Status [phone]) At least 20 percent but less than 40 percent impaired, limited or restricted  20%.  Pt demo'd significantly improved musculoskeletal changes with gait and pelvic floor strength/endurance in gravity eliminated positions. Pt would still benefit from skilled PT but pt has selected to self-d/c.      Problem List There are no active problems to display for this patient.   Jerl Mina ,PT, DPT, E-RYT  03/08/2015, 7:20 PM  [place] MAIN [facility] SERVICES [address] [place], [place], [address] Phone:  [phone]   Fax:  [phone]  Name: Alyssa Christensen MRN: [id number] Date of Birth: [birth year]

## 2015-08-04 ENCOUNTER — Ambulatory Visit: Payer: BLUE CROSS/BLUE SHIELD | Attending: Obstetrics and Gynecology

## 2015-08-04 ENCOUNTER — Other Ambulatory Visit: Payer: BLUE CROSS/BLUE SHIELD

## 2015-09-20 ENCOUNTER — Ambulatory Visit
Admission: RE | Admit: 2015-09-20 | Discharge: 2015-09-20 | Disposition: A | Discharge status: Home / Self Care | Payer: Medicare Other | Source: Ambulatory Visit | Attending: Obstetrics and Gynecology | Admitting: Obstetrics and Gynecology

## 2015-09-20 DIAGNOSIS — N6002 Solitary cyst of left breast: Secondary | ICD-10-CM | POA: Insufficient documentation

## 2015-09-20 DIAGNOSIS — N63 Unspecified lump in breast: Secondary | ICD-10-CM | POA: Diagnosis present

## 2015-09-20 DIAGNOSIS — N6001 Solitary cyst of right breast: Secondary | ICD-10-CM | POA: Insufficient documentation

## 2015-09-20 DIAGNOSIS — N6310 Unspecified lump in the right breast, unspecified quadrant: Secondary | ICD-10-CM

## 2016-03-19 ENCOUNTER — Encounter
Admission: EM | Disposition: A | Discharge status: Home / Self Care | Payer: Self-pay | Source: Home / Self Care | Attending: Internal Medicine

## 2016-03-19 ENCOUNTER — Emergency Department: Payer: Medicare Other

## 2016-03-19 ENCOUNTER — Observation Stay (HOSPITAL_BASED_OUTPATIENT_CLINIC_OR_DEPARTMENT_OTHER)
Admit: 2016-03-19 | Discharge: 2016-03-19 | Disposition: A | Discharge status: Home / Self Care | Payer: Medicare Other | Attending: Internal Medicine | Admitting: Internal Medicine

## 2016-03-19 ENCOUNTER — Inpatient Hospital Stay
Admission: EM | Admit: 2016-03-19 | Discharge: 2016-03-21 | DRG: 281 | Disposition: A | Discharge status: Home / Self Care | Payer: Medicare Other | Attending: Internal Medicine | Admitting: Internal Medicine

## 2016-03-19 DIAGNOSIS — Z9889 Other specified postprocedural states: Secondary | ICD-10-CM

## 2016-03-19 DIAGNOSIS — R079 Chest pain, unspecified: Secondary | ICD-10-CM | POA: Diagnosis present

## 2016-03-19 DIAGNOSIS — M810 Age-related osteoporosis without current pathological fracture: Secondary | ICD-10-CM | POA: Diagnosis present

## 2016-03-19 DIAGNOSIS — I200 Unstable angina: Secondary | ICD-10-CM | POA: Diagnosis not present

## 2016-03-19 DIAGNOSIS — I25110 Atherosclerotic heart disease of native coronary artery with unstable angina pectoris: Secondary | ICD-10-CM | POA: Diagnosis present

## 2016-03-19 DIAGNOSIS — I340 Nonrheumatic mitral (valve) insufficiency: Secondary | ICD-10-CM | POA: Diagnosis present

## 2016-03-19 DIAGNOSIS — Z79899 Other long term (current) drug therapy: Secondary | ICD-10-CM

## 2016-03-19 DIAGNOSIS — I5181 Takotsubo syndrome: Secondary | ICD-10-CM | POA: Diagnosis present

## 2016-03-19 DIAGNOSIS — Z886 Allergy status to analgesic agent status: Secondary | ICD-10-CM

## 2016-03-19 DIAGNOSIS — R7989 Other specified abnormal findings of blood chemistry: Secondary | ICD-10-CM

## 2016-03-19 DIAGNOSIS — Z880 Allergy status to penicillin: Secondary | ICD-10-CM

## 2016-03-19 DIAGNOSIS — I5022 Chronic systolic (congestive) heart failure: Secondary | ICD-10-CM | POA: Diagnosis present

## 2016-03-19 DIAGNOSIS — Z7982 Long term (current) use of aspirin: Secondary | ICD-10-CM

## 2016-03-19 DIAGNOSIS — R778 Other specified abnormalities of plasma proteins: Secondary | ICD-10-CM

## 2016-03-19 DIAGNOSIS — E876 Hypokalemia: Secondary | ICD-10-CM | POA: Diagnosis present

## 2016-03-19 DIAGNOSIS — Z809 Family history of malignant neoplasm, unspecified: Secondary | ICD-10-CM

## 2016-03-19 DIAGNOSIS — I110 Hypertensive heart disease with heart failure: Secondary | ICD-10-CM | POA: Diagnosis present

## 2016-03-19 DIAGNOSIS — I10 Essential (primary) hypertension: Secondary | ICD-10-CM | POA: Diagnosis present

## 2016-03-19 DIAGNOSIS — E039 Hypothyroidism, unspecified: Secondary | ICD-10-CM | POA: Diagnosis present

## 2016-03-19 DIAGNOSIS — Z803 Family history of malignant neoplasm of breast: Secondary | ICD-10-CM

## 2016-03-19 DIAGNOSIS — Z7902 Long term (current) use of antithrombotics/antiplatelets: Secondary | ICD-10-CM

## 2016-03-19 DIAGNOSIS — Z888 Allergy status to other drugs, medicaments and biological substances status: Secondary | ICD-10-CM

## 2016-03-19 DIAGNOSIS — N6019 Diffuse cystic mastopathy of unspecified breast: Secondary | ICD-10-CM | POA: Diagnosis present

## 2016-03-19 DIAGNOSIS — I214 Non-ST elevation (NSTEMI) myocardial infarction: Principal | ICD-10-CM | POA: Diagnosis present

## 2016-03-19 DIAGNOSIS — I519 Heart disease, unspecified: Secondary | ICD-10-CM

## 2016-03-19 DIAGNOSIS — I2510 Atherosclerotic heart disease of native coronary artery without angina pectoris: Secondary | ICD-10-CM

## 2016-03-19 DIAGNOSIS — E785 Hyperlipidemia, unspecified: Secondary | ICD-10-CM | POA: Diagnosis present

## 2016-03-19 DIAGNOSIS — E0590 Thyrotoxicosis, unspecified without thyrotoxic crisis or storm: Secondary | ICD-10-CM | POA: Diagnosis present

## 2016-03-19 HISTORY — PX: CARDIAC CATHETERIZATION: SHX172

## 2016-03-19 HISTORY — DX: Hypothyroidism, unspecified: E03.9

## 2016-03-19 HISTORY — DX: Heart disease, unspecified: I51.9

## 2016-03-19 HISTORY — DX: Diffuse cystic mastopathy of unspecified breast: N60.19

## 2016-03-19 HISTORY — DX: Vascular disorder of intestine, unspecified: K55.9

## 2016-03-19 HISTORY — DX: Essential (primary) hypertension: I10

## 2016-03-19 HISTORY — DX: Hyperlipidemia, unspecified: E78.5

## 2016-03-19 LAB — CBC
HCT: 39.8 % (ref 35.0–47.0)
Hemoglobin: 13.4 g/dL (ref 12.0–16.0)
MCH: 31.4 pg (ref 26.0–34.0)
MCHC: 33.5 g/dL (ref 32.0–36.0)
MCV: 93.6 fL (ref 80.0–100.0)
PLATELETS: 173 10*3/uL (ref 150–440)
RBC: 4.26 MIL/uL (ref 3.80–5.20)
RDW: 15.3 % — AB (ref 11.5–14.5)
WBC: 6.2 10*3/uL (ref 3.6–11.0)

## 2016-03-19 LAB — APTT: APTT: 27 s (ref 24–36)

## 2016-03-19 LAB — TSH: TSH: 2.548 u[IU]/mL (ref 0.350–4.500)

## 2016-03-19 LAB — MAGNESIUM: Magnesium: 2 mg/dL (ref 1.7–2.4)

## 2016-03-19 LAB — BASIC METABOLIC PANEL
Anion gap: 10 (ref 5–15)
BUN: 25 mg/dL — AB (ref 6–20)
CALCIUM: 9.9 mg/dL (ref 8.9–10.3)
CHLORIDE: 104 mmol/L (ref 101–111)
CO2: 27 mmol/L (ref 22–32)
CREATININE: 1.09 mg/dL — AB (ref 0.44–1.00)
GFR calc Af Amer: 53 mL/min — ABNORMAL LOW (ref >60)
GFR calc non Af Amer: 46 mL/min — ABNORMAL LOW (ref >60)
Glucose, Bld: 132 mg/dL — ABNORMAL HIGH (ref 65–99)
Potassium: 3.5 mmol/L (ref 3.5–5.1)
Sodium: 141 mmol/L (ref 135–145)

## 2016-03-19 LAB — PROTIME-INR
INR: 0.87
Prothrombin Time: 11.8 seconds (ref 11.4–15.2)

## 2016-03-19 LAB — TROPONIN I
TROPONIN I: 2.99 ng/mL — AB (ref <0.03)
Troponin I: 0.24 ng/mL (ref <0.03)
Troponin I: 18.1 ng/mL (ref <0.03)

## 2016-03-19 SURGERY — LEFT HEART CATH AND CORONARY ANGIOGRAPHY
Anesthesia: Moderate Sedation

## 2016-03-19 MED ORDER — CALCIUM CARBONATE-VITAMIN D 500-400 MG-UNIT PO TABS: 1.0000 | ORAL_TABLET | Freq: Two times a day (BID) | ORAL | Status: DC

## 2016-03-19 MED ORDER — SODIUM CHLORIDE 0.9 % IV SOLN: 250.0000 mL | INTRAVENOUS | Status: DC | PRN

## 2016-03-19 MED ORDER — SODIUM CHLORIDE 0.9% FLUSH: 3.0000 mL | Freq: Two times a day (BID) | INTRAVENOUS | Status: DC

## 2016-03-19 MED ORDER — SODIUM CHLORIDE 0.9 % IV SOLN
INTRAVENOUS | Status: AC
  Administered 2016-03-19: 17:00:00 via INTRAVENOUS

## 2016-03-19 MED ORDER — METOPROLOL TARTRATE 25 MG PO TABS: 12.5000 mg | ORAL_TABLET | Freq: Two times a day (BID) | ORAL | Status: DC

## 2016-03-19 MED ORDER — POTASSIUM CHLORIDE CRYS ER 20 MEQ PO TBCR
40.0000 meq | EXTENDED_RELEASE_TABLET | Freq: Once | ORAL | Status: AC
  Administered 2016-03-19: 40 meq via ORAL
  Filled 2016-03-19: qty 2

## 2016-03-19 MED ORDER — FENTANYL CITRATE (PF) 100 MCG/2ML IJ SOLN
INTRAMUSCULAR | Status: DC | PRN
  Administered 2016-03-19: 25 ug via INTRAVENOUS

## 2016-03-19 MED ORDER — SODIUM CHLORIDE 0.9% FLUSH
3.0000 mL | Freq: Two times a day (BID) | INTRAVENOUS | Status: DC
  Administered 2016-03-19 – 2016-03-20 (×2): 3 mL via INTRAVENOUS

## 2016-03-19 MED ORDER — SODIUM CHLORIDE 0.9% FLUSH
3.0000 mL | Freq: Two times a day (BID) | INTRAVENOUS | Status: DC
  Administered 2016-03-20 (×2): 3 mL via INTRAVENOUS

## 2016-03-19 MED ORDER — MIDAZOLAM HCL 2 MG/2ML IJ SOLN
INTRAMUSCULAR | Status: AC
  Filled 2016-03-19: qty 2

## 2016-03-19 MED ORDER — HEPARIN SODIUM (PORCINE) 5000 UNIT/ML IJ SOLN
60.0000 [IU]/kg | Freq: Once | INTRAMUSCULAR | Status: AC
  Administered 2016-03-19: 3950 [IU] via INTRAVENOUS
  Filled 2016-03-19: qty 1

## 2016-03-19 MED ORDER — SODIUM CHLORIDE 0.9% FLUSH: 3.0000 mL | INTRAVENOUS | Status: DC | PRN

## 2016-03-19 MED ORDER — HEPARIN (PORCINE) IN NACL 100-0.45 UNIT/ML-% IJ SOLN
700.0000 [IU]/h | INTRAMUSCULAR | Status: DC
  Administered 2016-03-19: 750 [IU]/h via INTRAVENOUS
  Administered 2016-03-21: 700 [IU]/h via INTRAVENOUS
  Filled 2016-03-19 (×2): qty 250

## 2016-03-19 MED ORDER — NITROGLYCERIN 2 % TD OINT
0.5000 [in_us] | TOPICAL_OINTMENT | Freq: Once | TRANSDERMAL | Status: AC
  Administered 2016-03-19: 0.5 [in_us] via TOPICAL

## 2016-03-19 MED ORDER — ACETAMINOPHEN 325 MG PO TABS: 650.0000 mg | ORAL_TABLET | Freq: Four times a day (QID) | ORAL | Status: DC | PRN

## 2016-03-19 MED ORDER — HEPARIN (PORCINE) IN NACL 2-0.9 UNIT/ML-% IJ SOLN
INTRAMUSCULAR | Status: AC
  Filled 2016-03-19: qty 500

## 2016-03-19 MED ORDER — LEVOTHYROXINE SODIUM 50 MCG PO TABS
50.0000 ug | ORAL_TABLET | Freq: Every day | ORAL | Status: DC
  Administered 2016-03-20 – 2016-03-21 (×2): 50 ug via ORAL
  Filled 2016-03-19 (×2): qty 1

## 2016-03-19 MED ORDER — ATORVASTATIN CALCIUM 20 MG PO TABS
40.0000 mg | ORAL_TABLET | Freq: Every day | ORAL | Status: DC
  Administered 2016-03-19 – 2016-03-20 (×2): 40 mg via ORAL
  Filled 2016-03-19 (×2): qty 2

## 2016-03-19 MED ORDER — LOSARTAN POTASSIUM 25 MG PO TABS
25.0000 mg | ORAL_TABLET | Freq: Every day | ORAL | Status: DC
Start: 2016-03-20 — End: 2016-03-21
  Administered 2016-03-20 – 2016-03-21 (×2): 25 mg via ORAL
  Filled 2016-03-19 (×2): qty 1

## 2016-03-19 MED ORDER — FENTANYL CITRATE (PF) 100 MCG/2ML IJ SOLN
INTRAMUSCULAR | Status: AC
  Filled 2016-03-19: qty 2

## 2016-03-19 MED ORDER — HEPARIN SODIUM (PORCINE) 1000 UNIT/ML IJ SOLN
INTRAMUSCULAR | Status: DC | PRN
  Administered 2016-03-19: 3000 [IU] via INTRAVENOUS

## 2016-03-19 MED ORDER — NITROGLYCERIN 0.4 MG/SPRAY TL SOLN: 1.0000 | Status: DC | PRN

## 2016-03-19 MED ORDER — MIDAZOLAM HCL 2 MG/2ML IJ SOLN
INTRAMUSCULAR | Status: DC | PRN
  Administered 2016-03-19: 0.5 mg via INTRAVENOUS

## 2016-03-19 MED ORDER — SODIUM CHLORIDE 0.9 % IV SOLN
INTRAVENOUS | Status: DC
  Administered 2016-03-19: 12:00:00 via INTRAVENOUS

## 2016-03-19 MED ORDER — VERAPAMIL HCL 2.5 MG/ML IV SOLN
INTRAVENOUS | Status: AC
  Filled 2016-03-19: qty 2

## 2016-03-19 MED ORDER — SODIUM CHLORIDE 0.9 % WEIGHT BASED INFUSION: 1.0000 mL/kg/h | INTRAVENOUS | Status: DC

## 2016-03-19 MED ORDER — NITROGLYCERIN 2 % TD OINT
TOPICAL_OINTMENT | TRANSDERMAL | Status: AC
  Filled 2016-03-19: qty 1

## 2016-03-19 MED ORDER — HEPARIN SODIUM (PORCINE) 1000 UNIT/ML IJ SOLN
INTRAMUSCULAR | Status: AC
  Filled 2016-03-19: qty 1

## 2016-03-19 MED ORDER — CALCIUM CITRATE-VITAMIN D 500-400 MG-UNIT PO CHEW
1.0000 | CHEWABLE_TABLET | Freq: Two times a day (BID) | ORAL | Status: DC
  Administered 2016-03-19 – 2016-03-21 (×5): 1 via ORAL
  Filled 2016-03-19 (×5): qty 1

## 2016-03-19 MED ORDER — ASPIRIN 81 MG PO CHEW
324.0000 mg | CHEWABLE_TABLET | Freq: Once | ORAL | Status: AC
  Administered 2016-03-19: 324 mg via ORAL
  Filled 2016-03-19: qty 4

## 2016-03-19 MED ORDER — ASPIRIN 81 MG PO CHEW: 81.0000 mg | CHEWABLE_TABLET | ORAL | Status: DC

## 2016-03-19 MED ORDER — LIDOCAINE HCL (PF) 1 % IJ SOLN
INTRAMUSCULAR | Status: AC
  Filled 2016-03-19: qty 30

## 2016-03-19 MED ORDER — ASPIRIN 81 MG PO CHEW
81.0000 mg | CHEWABLE_TABLET | Freq: Every day | ORAL | Status: DC
  Administered 2016-03-20 – 2016-03-21 (×2): 81 mg via ORAL
  Filled 2016-03-19 (×2): qty 1

## 2016-03-19 MED ORDER — SODIUM CHLORIDE 0.9 % WEIGHT BASED INFUSION
3.0000 mL/kg/h | INTRAVENOUS | Status: DC
  Administered 2016-03-19: 3 mL/kg/h via INTRAVENOUS

## 2016-03-19 MED ORDER — HEPARIN SODIUM (PORCINE) 5000 UNIT/ML IJ SOLN: 5000.0000 [IU] | Freq: Three times a day (TID) | INTRAMUSCULAR | Status: DC

## 2016-03-19 MED ORDER — IOPAMIDOL (ISOVUE-300) INJECTION 61%
INTRAVENOUS | Status: DC | PRN
  Administered 2016-03-19: 70 mL via INTRA_ARTERIAL

## 2016-03-19 MED ORDER — NITROGLYCERIN 0.4 MG SL SUBL
0.4000 mg | SUBLINGUAL_TABLET | SUBLINGUAL | Status: DC | PRN
  Administered 2016-03-21: 0.4 mg via SUBLINGUAL
  Filled 2016-03-19: qty 1

## 2016-03-19 MED ORDER — METOPROLOL TARTRATE 25 MG PO TABS
25.0000 mg | ORAL_TABLET | Freq: Two times a day (BID) | ORAL | Status: DC
  Administered 2016-03-19 – 2016-03-21 (×3): 25 mg via ORAL
  Filled 2016-03-19 (×4): qty 1

## 2016-03-19 MED ORDER — ACETAMINOPHEN 650 MG RE SUPP: 650.0000 mg | Freq: Four times a day (QID) | RECTAL | Status: DC | PRN

## 2016-03-19 MED ORDER — HEPARIN (PORCINE) IN NACL 100-0.45 UNIT/ML-% IJ SOLN
750.0000 [IU]/h | INTRAMUSCULAR | Status: DC
  Administered 2016-03-19: 750 [IU]/h via INTRAVENOUS
  Filled 2016-03-19: qty 250

## 2016-03-19 SURGICAL SUPPLY — 8 items
CATH 5F 110X4 TIG (CATHETERS) ×3 IMPLANT
CATH INFINITI 5FR ANG PIGTAIL (CATHETERS) ×3 IMPLANT
DEVICE RAD TR BAND REGULAR (VASCULAR PRODUCTS) ×3 IMPLANT
GLIDESHEATH SLEND SS 6F .021 (SHEATH) ×3 IMPLANT
KIT MANI 3VAL PERCEP (MISCELLANEOUS) ×3 IMPLANT
PACK CARDIAC CATH (CUSTOM PROCEDURE TRAY) ×3 IMPLANT
WIRE HITORQ VERSACORE ST 145CM (WIRE) ×3 IMPLANT
WIRE SAFE-T 1.5MM-J .035X260CM (WIRE) ×3 IMPLANT

## 2016-03-19 NOTE — Progress Notes (Signed)
Cardiology Progress Note  Though cardiac catheterization did not show any obstructive lesion to explain patient's chest pain early this morning, we will plan to restart heparin infusion for medical therapy of NSTEMI 4 hours after TR band has been removed.  Yvonne Kendallhristopher Brighten Buzzelli, MD [facility] Pager: [phone]

## 2016-03-19 NOTE — H&P (View-Only) (Signed)
Cardiology Consultation Note  Patient ID: Alyssa Christensen, MRN: [id number], DOB/AGE: [birth year] 82 y.o. Admit date: 03/19/2016   Date of Consult: 03/19/2016 Primary Physician: Barbette Reichmann, MD Primary Cardiologist: New to [facility] Requesting Physician: Dr. Renae Gloss, MD  Chief Complaint: Chest pain Reason for Consult: NSTEMI  HPI: 82 y.o. female with h/o systolic dysfunction with reported prior EF of 40-45% with moderate mitral regurgitation by echo in 2010 per PCP with negative nuclear stress test at that time per patient. Also with history of fibrocystic breast disease, HTN, HLD, hypothyroidism, and osteoporosis who presented to [facility] on 11/14 with severe chest pain that began around 3 AM and lasted for approximately 2 hours. Cardiology is consulted for elevated troponin of 0.24.   Patient reports a long history of intermittent nonexertional chest pain for many years that she has attributed to her fibrocystic breast disease. Those chest pain episodes are usually left-sided and last for a couple of minutes and self resolve. She went to bed the prior night in her usual state of health. She was woken up around 3 AM with severe chest pain that encompassed her entire chest and radiated down her left arm. Pain was pressure-like and radiated a 10/10. There was some associated SOB, otherwise no diaphoresis, nausea, vomiting, palpitations, dizziness, presyncope, or syncope. She did end up falling back asleep until around 5 AM when the pain was still present, though not as severe. She noted this pain was different than any prior episodes of pain in that is was much more severe and longer lasting. She has never had pain like this before. She decided to call EMS. Upon EMS arrival she was chest pain free without any intervention. Because of her symptoms she was transported to [facility].   Upon the patient's arrival to [facility] they were found to have an initial troponin of 0.24, SCR 1.09, K+ 3.5, hgb 13.4, wbc 6.2. ECG showed  NSR, 73 bpm, nonspecific lateral st elevation of 1 mm in V6 without reciprocal st changes, CXR showed negative. She was given ASA 325 mg, placed on oxygen via nasal cannula, nitropaste was applied and started on heparin gtt per pharmacy. She is currently chest pain free. She is scheduled for cardiac cath this afternoon with Dr. Okey Dupre, MD.    Past Medical History:  Diagnosis Date  . Allergy   . Depression   . Essential hypertension   . Fibrocystic breast disease   . HLD (hyperlipidemia)   . Hypothyroidism   . Ischemic bowel disease (HCC) 2011  . Osteoporosis   . Systolic dysfunction    a. PCP with reported echo from 2010 showing EF 40-45%, mod MR; b. pt reported nuc following 2010 echo was normal      Most Recent Cardiac Studies: None available for review   Surgical History:  Past Surgical History:  Procedure Laterality Date  . BREAST BIOPSY Bilateral    neg  . BREAST SURGERY     "to remove nodules that turned out to be benign"  . foot surgeries     bilaterally to remove bunions  . varicose vein "stripped"     pt wears compression stocking now     Home Meds: Prior to Admission medications   Medication Sig Start Date End Date Taking? Authorizing Provider  aspirin 81 MG chewable tablet Chew 81 mg by mouth daily.   Yes Historical Provider, MD  calcium-vitamin D (SM CALCIUM 500/VITAMIN D3) 500-400 MG-UNIT tablet Take by mouth.   Yes Historical Provider, MD  levothyroxine (SYNTHROID,  LEVOTHROID) 50 MCG tablet TAKE ONE TABLET BY MOUTH ONCE DAILY. TAKE ON AN EMPTY STOMACH WITH A GLASS OF WATER AT LEAST 30-60 MINUTES BEFORE BREAKFAST 01/09/16  Yes Historical Provider, MD  losartan (COZAAR) 25 MG tablet TAKE ONE TABLET BY MOUTH ONCE DAILY 01/22/16  Yes Historical Provider, MD  metoprolol (LOPRESSOR) 50 MG tablet TAKE ONE TABLET BY MOUTH daily 07/26/15  Yes Historical Provider, MD  Multiple Vitamin (MULTI-VITAMINS) TABS Take by mouth.   Yes Historical Provider, MD  simvastatin (ZOCOR) 20 MG  tablet Take by mouth. 06/08/15  Yes Historical Provider, MD    Inpatient Medications:  . [START ON 03/20/2016] aspirin  81 mg Oral Daily  . atorvastatin  40 mg Oral q1800  . calcium citrate-vitamin D  1 tablet Oral BID  . [START ON 03/20/2016] levothyroxine  50 mcg Oral QAC breakfast  . [START ON 03/20/2016] losartan  25 mg Oral Daily  . metoprolol tartrate  25 mg Oral BID  . potassium chloride  40 mEq Oral Once  . sodium chloride flush  3 mL Intravenous Q12H   . heparin 750 Units/hr (03/19/16 7846)    Allergies:  Allergies  Allergen Reactions  . Ampicillin   . Codeine   . Mevacor [Lovastatin]   . Valium [Diazepam]     Social History   Social History  . Marital status: Married    Spouse name: N/A  . Number of children: N/A  . Years of education: N/A   Occupational History  . Not on file.   Social History Main Topics  . Smoking status: Never Smoker  . Smokeless tobacco: Never Used  . Alcohol use No  . Drug use: No  . Sexual activity: No   Other Topics Concern  . Not on file   Social History Narrative  . No narrative on file     Family History  Problem Relation Age of Onset  . Breast cancer Paternal Aunt 63  . Cancer Mother   . Cancer Father      Review of Systems: Review of Systems  Constitutional: Positive for malaise/fatigue. Negative for chills, diaphoresis, fever and weight loss.  HENT: Negative for congestion.   Eyes: Negative for discharge and redness.  Respiratory: Positive for shortness of breath. Negative for cough, hemoptysis, sputum production and wheezing.   Cardiovascular: Positive for chest pain. Negative for palpitations, orthopnea, claudication, leg swelling and PND.  Gastrointestinal: Negative for abdominal pain, blood in stool, heartburn, melena, nausea and vomiting.  Genitourinary: Negative for hematuria.  Musculoskeletal: Negative for falls and myalgias.  Skin: Negative for rash.  Neurological: Positive for weakness. Negative for  dizziness, tingling, tremors, sensory change, speech change, focal weakness and loss of consciousness.  Endo/Heme/Allergies: Does not bruise/bleed easily.  Psychiatric/Behavioral: Negative for substance abuse. The patient is not nervous/anxious.   All other systems reviewed and are negative.   Labs:  Recent Labs  03/19/16 0748  TROPONINI 0.24*   Lab Results  Component Value Date   WBC 6.2 03/19/2016   HGB 13.4 03/19/2016   HCT 39.8 03/19/2016   MCV 93.6 03/19/2016   PLT 173 03/19/2016     Recent Labs Lab 03/19/16 0748  NA 141  K 3.5  CL 104  CO2 27  BUN 25*  CREATININE 1.09*  CALCIUM 9.9  GLUCOSE 132*   Lab Results  Component Value Date   CHOL 169 10/02/2011   HDL 82 (H) 10/02/2011   LDLCALC 76 10/02/2011   TRIG 54 10/02/2011   No results  found for: DDIMER  Radiology/Studies:  Dg Chest 2 View  Result Date: 03/19/2016 CLINICAL DATA:  Patient reports left side chest pain onset this morning. Pain has since resolved. Hx CHF, COPD, HTN, breast biopsy. Non-smoker. EXAM: CHEST  2 VIEW COMPARISON:  10/01/2011 FINDINGS: The cardiac silhouette is normal in size and configuration. Normal mediastinal and hilar contours. Lungs are mildly hyperexpanded but clear. No pleural effusion or pneumothorax. Skeletal structures are demineralized but intact. IMPRESSION: No acute cardiopulmonary disease. Electronically Signed   By: Amie Portlandavid  Ormond M.D.   On: 03/19/2016 08:18    EKG: Interpreted by me showed: NSR, 73 bpm, nonspecific lateral st elevation in V6 of 1 mm without reciprocal changes Telemetry: Interpreted by me showed: NSR, 70's to 80's bpm  Weights: Filed Weights   03/19/16 0739  Weight: 145 lb (65.8 kg)     Physical Exam: Blood pressure (!) 141/76, pulse 84, temperature 97.5 F (36.4 C), temperature source Oral, resp. rate 16, height 5\' 6"  (1.676 m), weight 145 lb (65.8 kg), SpO2 97 %. Body mass index is 23.4 kg/m. General: Well developed, well nourished, in no acute  distress. Head: Normocephalic, atraumatic, sclera non-icteric, no xanthomas, nares are without discharge.  Neck: Negative for carotid bruits. JVD not elevated. Lungs: Clear bilaterally to auscultation without wheezes, rales, or rhonchi. Breathing is unlabored. Heart: RRR with S1 S2. No murmurs, rubs, or gallops appreciated. Abdomen: Soft, non-tender, non-distended with normoactive bowel sounds. No hepatomegaly. No rebound/guarding. No obvious abdominal masses. Msk:  Strength and tone appear normal for age. Extremities: No clubbing or cyanosis. No edema. Distal pedal pulses are 2+ and equal bilaterally. Neuro: Alert and oriented X 3. No facial asymmetry. No focal deficit. Moves all extremities spontaneously. Psych:  Responds to questions appropriately with a normal affect.    Assessment and Plan:  Principal Problem:   NSTEMI (non-ST elevated myocardial infarction) (HCC) Active Problems:   Chest pain   Essential hypertension   Hypothyroidism   Hypokalemia   HLD (hyperlipidemia)   Systolic dysfunction   Osteoporosis   Fibrocystic breast disease    1. NSTEMI: -Currently chest pain free -Pain began around 3 AM and persisted to what she believes was 5 AM and self resolved. Some associated SOB, otherwise no associated symptoms -Has had intermittent chest pain for years that she has attributed to her fibrocystic breast disease -This episode of chest pain was much more severe than prior episodes, encompassed her entire chest and last much longer leading her to call EMS and be brought to the hospital  -Heparin gtt -ASA 325 mg -Nitro paste -Oxygen -PRN morphine -PRN SL NTG -Schedule for cardiac catheterization with Dr. Okey DupreEnd, MD around noon today -Risks and benefits of cardiac catheterization have been discussed with the patient including risks of bleeding, bruising, infection, kidney damage, stroke, heart attack, and death. The patient understands these risks and is willing to proceed with  the procedure. All questions have been answered and concerns listened to -Initial troponin 0.24, continue to cycle until peak -Check echo, fasting lipid panel in the morning and A1C for further risk stratification  -Continue Lopressor, consider changing to Coreg s/p cardiac cath -Continue Lipitor 40 mg, if needed titrate  -Continue losartan  2. Systolic dysfunction: -PCP has reported in note a echo from 2010 with EF 40-45% with moderate mitral regurgitation -No echo report for viewing on file -She does not appear to be volume overloaded at this time -This EF was apparently followed up with a nuclear stress test years ago  that she reports as normal, not on file for review -No prior cardiac caths -Continue beta blocker and losartan  -Check echo as above  3. Accelerated HTN: -Likely in the setting of no medications this AM and her chest pain -As above  4. Hypothyroidism: -Levothyroxine  -Check TSH  5. Fibrocystic breast disease: -This appears to be a primary cardiac event/ACS rather than her breast pain  6. Hypokalemia: -Replete to goal of 4.0 -Check magnesium and replete to 2.0 as needed  7. HLD: -Check lipid panel as above -Lipitor 40 mg daily, titrate as needed   Signed, Eula ListenRyan Shaheen Star, PA-C [facility] Pager: [phone] 03/19/2016, 10:25 AM

## 2016-03-19 NOTE — Consult Note (Signed)
Cardiology Consultation Note  Patient ID: Alyssa Christensen, MRN: [id number], DOB/AGE: [birth year] 82 y.o. Admit date: 03/19/2016   Date of Consult: 03/19/2016 Primary Physician: Barbette Reichmann, MD Primary Cardiologist: New to [facility] Requesting Physician: Dr. Renae Gloss, MD  Chief Complaint: Chest pain Reason for Consult: NSTEMI  HPI: 82 y.o. female with h/o systolic dysfunction with reported prior EF of 40-45% with moderate mitral regurgitation by echo in 2010 per PCP with negative nuclear stress test at that time per patient. Also with history of fibrocystic breast disease, HTN, HLD, hypothyroidism, and osteoporosis who presented to [facility] on 11/14 with severe chest pain that began around 3 AM and lasted for approximately 2 hours. Cardiology is consulted for elevated troponin of 0.24.   Patient reports a long history of intermittent nonexertional chest pain for many years that she has attributed to her fibrocystic breast disease. Those chest pain episodes are usually left-sided and last for a couple of minutes and self resolve. She went to bed the prior night in her usual state of health. She was woken up around 3 AM with severe chest pain that encompassed her entire chest and radiated down her left arm. Pain was pressure-like and radiated a 10/10. There was some associated SOB, otherwise no diaphoresis, nausea, vomiting, palpitations, dizziness, presyncope, or syncope. She did end up falling back asleep until around 5 AM when the pain was still present, though not as severe. She noted this pain was different than any prior episodes of pain in that is was much more severe and longer lasting. She has never had pain like this before. She decided to call EMS. Upon EMS arrival she was chest pain free without any intervention. Because of her symptoms she was transported to [facility].   Upon the patient's arrival to [facility] they were found to have an initial troponin of 0.24, SCR 1.09, K+ 3.5, hgb 13.4, wbc 6.2. ECG showed  NSR, 73 bpm, nonspecific lateral st elevation of 1 mm in V6 without reciprocal st changes, CXR showed negative. She was given ASA 325 mg, placed on oxygen via nasal cannula, nitropaste was applied and started on heparin gtt per pharmacy. She is currently chest pain free. She is scheduled for cardiac cath this afternoon with Dr. Okey Dupre, MD.    Past Medical History:  Diagnosis Date  . Allergy   . Depression   . Essential hypertension   . Fibrocystic breast disease   . HLD (hyperlipidemia)   . Hypothyroidism   . Ischemic bowel disease (HCC) 2011  . Osteoporosis   . Systolic dysfunction    a. PCP with reported echo from 2010 showing EF 40-45%, mod MR; b. pt reported nuc following 2010 echo was normal      Most Recent Cardiac Studies: None available for review   Surgical History:  Past Surgical History:  Procedure Laterality Date  . BREAST BIOPSY Bilateral    neg  . BREAST SURGERY     "to remove nodules that turned out to be benign"  . foot surgeries     bilaterally to remove bunions  . varicose vein "stripped"     pt wears compression stocking now     Home Meds: Prior to Admission medications   Medication Sig Start Date End Date Taking? Authorizing Provider  aspirin 81 MG chewable tablet Chew 81 mg by mouth daily.   Yes Historical Provider, MD  calcium-vitamin D (SM CALCIUM 500/VITAMIN D3) 500-400 MG-UNIT tablet Take by mouth.   Yes Historical Provider, MD  levothyroxine (SYNTHROID,  LEVOTHROID) 50 MCG tablet TAKE ONE TABLET BY MOUTH ONCE DAILY. TAKE ON AN EMPTY STOMACH WITH A GLASS OF WATER AT LEAST 30-60 MINUTES BEFORE BREAKFAST 01/09/16  Yes Historical Provider, MD  losartan (COZAAR) 25 MG tablet TAKE ONE TABLET BY MOUTH ONCE DAILY 01/22/16  Yes Historical Provider, MD  metoprolol (LOPRESSOR) 50 MG tablet TAKE ONE TABLET BY MOUTH daily 07/26/15  Yes Historical Provider, MD  Multiple Vitamin (MULTI-VITAMINS) TABS Take by mouth.   Yes Historical Provider, MD  simvastatin (ZOCOR) 20 MG  tablet Take by mouth. 06/08/15  Yes Historical Provider, MD    Inpatient Medications:  . [START ON 03/20/2016] aspirin  81 mg Oral Daily  . atorvastatin  40 mg Oral q1800  . calcium citrate-vitamin D  1 tablet Oral BID  . [START ON 03/20/2016] levothyroxine  50 mcg Oral QAC breakfast  . [START ON 03/20/2016] losartan  25 mg Oral Daily  . metoprolol tartrate  25 mg Oral BID  . potassium chloride  40 mEq Oral Once  . sodium chloride flush  3 mL Intravenous Q12H   . heparin 750 Units/hr (03/19/16 7846)    Allergies:  Allergies  Allergen Reactions  . Ampicillin   . Codeine   . Mevacor [Lovastatin]   . Valium [Diazepam]     Social History   Social History  . Marital status: Married    Spouse name: N/A  . Number of children: N/A  . Years of education: N/A   Occupational History  . Not on file.   Social History Main Topics  . Smoking status: Never Smoker  . Smokeless tobacco: Never Used  . Alcohol use No  . Drug use: No  . Sexual activity: No   Other Topics Concern  . Not on file   Social History Narrative  . No narrative on file     Family History  Problem Relation Age of Onset  . Breast cancer Paternal Aunt 63  . Cancer Mother   . Cancer Father      Review of Systems: Review of Systems  Constitutional: Positive for malaise/fatigue. Negative for chills, diaphoresis, fever and weight loss.  HENT: Negative for congestion.   Eyes: Negative for discharge and redness.  Respiratory: Positive for shortness of breath. Negative for cough, hemoptysis, sputum production and wheezing.   Cardiovascular: Positive for chest pain. Negative for palpitations, orthopnea, claudication, leg swelling and PND.  Gastrointestinal: Negative for abdominal pain, blood in stool, heartburn, melena, nausea and vomiting.  Genitourinary: Negative for hematuria.  Musculoskeletal: Negative for falls and myalgias.  Skin: Negative for rash.  Neurological: Positive for weakness. Negative for  dizziness, tingling, tremors, sensory change, speech change, focal weakness and loss of consciousness.  Endo/Heme/Allergies: Does not bruise/bleed easily.  Psychiatric/Behavioral: Negative for substance abuse. The patient is not nervous/anxious.   All other systems reviewed and are negative.   Labs:  Recent Labs  03/19/16 0748  TROPONINI 0.24*   Lab Results  Component Value Date   WBC 6.2 03/19/2016   HGB 13.4 03/19/2016   HCT 39.8 03/19/2016   MCV 93.6 03/19/2016   PLT 173 03/19/2016     Recent Labs Lab 03/19/16 0748  NA 141  K 3.5  CL 104  CO2 27  BUN 25*  CREATININE 1.09*  CALCIUM 9.9  GLUCOSE 132*   Lab Results  Component Value Date   CHOL 169 10/02/2011   HDL 82 (H) 10/02/2011   LDLCALC 76 10/02/2011   TRIG 54 10/02/2011   No results  found for: DDIMER  Radiology/Studies:  Dg Chest 2 View  Result Date: 03/19/2016 CLINICAL DATA:  Patient reports left side chest pain onset this morning. Pain has since resolved. Hx CHF, COPD, HTN, breast biopsy. Non-smoker. EXAM: CHEST  2 VIEW COMPARISON:  10/01/2011 FINDINGS: The cardiac silhouette is normal in size and configuration. Normal mediastinal and hilar contours. Lungs are mildly hyperexpanded but clear. No pleural effusion or pneumothorax. Skeletal structures are demineralized but intact. IMPRESSION: No acute cardiopulmonary disease. Electronically Signed   By: Amie Portlandavid  Ormond M.D.   On: 03/19/2016 08:18    EKG: Interpreted by me showed: NSR, 73 bpm, nonspecific lateral st elevation in V6 of 1 mm without reciprocal changes Telemetry: Interpreted by me showed: NSR, 70's to 80's bpm  Weights: Filed Weights   03/19/16 0739  Weight: 145 lb (65.8 kg)     Physical Exam: Blood pressure (!) 141/76, pulse 84, temperature 97.5 F (36.4 C), temperature source Oral, resp. rate 16, height 5\' 6"  (1.676 m), weight 145 lb (65.8 kg), SpO2 97 %. Body mass index is 23.4 kg/m. General: Well developed, well nourished, in no acute  distress. Head: Normocephalic, atraumatic, sclera non-icteric, no xanthomas, nares are without discharge.  Neck: Negative for carotid bruits. JVD not elevated. Lungs: Clear bilaterally to auscultation without wheezes, rales, or rhonchi. Breathing is unlabored. Heart: RRR with S1 S2. No murmurs, rubs, or gallops appreciated. Abdomen: Soft, non-tender, non-distended with normoactive bowel sounds. No hepatomegaly. No rebound/guarding. No obvious abdominal masses. Msk:  Strength and tone appear normal for age. Extremities: No clubbing or cyanosis. No edema. Distal pedal pulses are 2+ and equal bilaterally. Neuro: Alert and oriented X 3. No facial asymmetry. No focal deficit. Moves all extremities spontaneously. Psych:  Responds to questions appropriately with a normal affect.    Assessment and Plan:  Principal Problem:   NSTEMI (non-ST elevated myocardial infarction) (HCC) Active Problems:   Chest pain   Essential hypertension   Hypothyroidism   Hypokalemia   HLD (hyperlipidemia)   Systolic dysfunction   Osteoporosis   Fibrocystic breast disease    1. NSTEMI: -Currently chest pain free -Pain began around 3 AM and persisted to what she believes was 5 AM and self resolved. Some associated SOB, otherwise no associated symptoms -Has had intermittent chest pain for years that she has attributed to her fibrocystic breast disease -This episode of chest pain was much more severe than prior episodes, encompassed her entire chest and last much longer leading her to call EMS and be brought to the hospital  -Heparin gtt -ASA 325 mg -Nitro paste -Oxygen -PRN morphine -PRN SL NTG -Schedule for cardiac catheterization with Dr. Okey DupreEnd, MD around noon today -Risks and benefits of cardiac catheterization have been discussed with the patient including risks of bleeding, bruising, infection, kidney damage, stroke, heart attack, and death. The patient understands these risks and is willing to proceed with  the procedure. All questions have been answered and concerns listened to -Initial troponin 0.24, continue to cycle until peak -Check echo, fasting lipid panel in the morning and A1C for further risk stratification  -Continue Lopressor, consider changing to Coreg s/p cardiac cath -Continue Lipitor 40 mg, if needed titrate  -Continue losartan  2. Systolic dysfunction: -PCP has reported in note a echo from 2010 with EF 40-45% with moderate mitral regurgitation -No echo report for viewing on file -She does not appear to be volume overloaded at this time -This EF was apparently followed up with a nuclear stress test years ago  that she reports as normal, not on file for review -No prior cardiac caths -Continue beta blocker and losartan  -Check echo as above  3. Accelerated HTN: -Likely in the setting of no medications this AM and her chest pain -As above  4. Hypothyroidism: -Levothyroxine  -Check TSH  5. Fibrocystic breast disease: -This appears to be a primary cardiac event/ACS rather than her breast pain  6. Hypokalemia: -Replete to goal of 4.0 -Check magnesium and replete to 2.0 as needed  7. HLD: -Check lipid panel as above -Lipitor 40 mg daily, titrate as needed   Signed, Eula ListenRyan Kately Graffam, PA-C [facility] Pager: [phone] 03/19/2016, 10:25 AM

## 2016-03-19 NOTE — Progress Notes (Addendum)
ANTICOAGULATION CONSULT NOTE - Initial Consult  Pharmacy Consult for Heparin  Indication: chest pain/ACS  Allergies  Allergen Reactions  . Ampicillin   . Codeine   . Mevacor [Lovastatin]   . Valium [Diazepam]     Patient Measurements: Height: 5\' 6"  (167.6 cm) Weight: 146 lb 11.2 oz (66.5 kg) IBW/kg (Calculated) : 59.3 Heparin Dosing Weight: 65.8 kg  Vital Signs: Temp: 97.7 F (36.5 C) (11/14 1545) Temp Source: Oral (11/14 1545) BP: 130/56 (11/14 1545) Pulse Rate: 68 (11/14 1545)  Labs:  Recent Labs  03/19/16 0748 03/19/16 1117  HGB 13.4  --   HCT 39.8  --   PLT 173  --   APTT 27  --   LABPROT 11.8  --   INR 0.87  --   CREATININE 1.09*  --   TROPONINI 0.24* 2.99*    Estimated Creatinine Clearance: 36.6 mL/min (by C-G formula based on SCr of 1.09 mg/dL (H)).   Medical History: Past Medical History:  Diagnosis Date  . Allergy   . Depression   . Essential hypertension   . Fibrocystic breast disease   . HLD (hyperlipidemia)   . Hypothyroidism   . Ischemic bowel disease (HCC) 2011  . Osteoporosis   . Systolic dysfunction    a. PCP with reported echo from 2010 showing EF 40-45%, mod MR; b. pt reported nuc following 2010 echo was normal    Medications:  Scheduled:  . [START ON 03/20/2016] aspirin  81 mg Oral Daily  . atorvastatin  40 mg Oral q1800  . calcium citrate-vitamin D  1 tablet Oral BID  . [START ON 03/20/2016] levothyroxine  50 mcg Oral QAC breakfast  . [START ON 03/20/2016] losartan  25 mg Oral Daily  . metoprolol tartrate  25 mg Oral BID  . sodium chloride flush  3 mL Intravenous Q12H  . sodium chloride flush  3 mL Intravenous Q12H    Assessment: Pharmacy consulted to dose heparin in this 82 year old female.  MD wants drip started 4 hrs after TR band removal.  Band was removed on 11/14 @ 1314. Pt was already on heparin 5000 units SQ Q8H.  CrCl = 36.6 ml/min.  Goal of Therapy:  Heparin level 0.3-0.7 units/ml Monitor platelets by  anticoagulation protocol: Yes   Plan:  Start heparin infusion at 750 units/hr  Will draw HL 8 hrs after start of drip.  Will check CBC / HL daily.   11/15 04:00 heparin level 0.42. Continue current regimen. Recheck in 8 hours to confirm.   Robbins,Jason D 03/19/2016,8:33 PM

## 2016-03-19 NOTE — Care Management Obs Status (Signed)
MEDICARE OBSERVATION STATUS NOTIFICATION   Patient Details  Name: Alyssa Christensen MRN: [id number] Date of Birth: 1930/07/18   Medicare Observation Status Notification Given:  Yes    Berna BueCheryl Takia Runyon, RN 03/19/2016, 10:03 AM

## 2016-03-19 NOTE — ED Triage Notes (Signed)
Pt states she woke up with chest pain during the night with tightness lasting just a few minutes and went back to sleep, states she is concerned and wants to be checked out.. Denies pain at present.

## 2016-03-19 NOTE — ED Provider Notes (Signed)
[facility] Emergency Department Provider Note ____________________________________________   I have reviewed the triage vital signs and the triage nursing note.  HISTORY  Chief Complaint Chest Pain   Historian Patient  HPI Chuck HintSally I Arvin is a 82 y.o. female states that she woke up maybe around 5 AM feeling central and left-sided chest discomfort which she described as somewhat sharp and she thinks it may be in only lasted about 5 minutes, but she had persistent dull discomfort and aching into the left breast which she started to worry about and then called EMS out to her house and then they suggested that she come to the ED for further evaluation.  Denies known coronary artery disease. Does not take aspirin or other blood thinners. States that she had a stress test several years ago but does not follow with a cardiologist.  Denies nausea, sweats, indigestion, abdominal pain, coughing, trouble breathing, or fevers.    Past Medical History:  Diagnosis Date  . Allergy   . CHF (congestive heart failure) (HCC)   . Depression   . Osteoporosis     There are no active problems to display for this patient.   Past Surgical History:  Procedure Laterality Date  . BREAST BIOPSY Bilateral    neg  . BREAST SURGERY     "to remove nodules that turned out to be benign"  . foot surgeries     bilaterally to remove bunions  . varicose vein "stripped"     pt wears compression stocking now    Prior to Admission medications   Medication Sig Start Date End Date Taking? Authorizing Provider  aspirin 81 MG chewable tablet Chew 81 mg by mouth daily.   Yes Historical Provider, MD  calcium-vitamin D (SM CALCIUM 500/VITAMIN D3) 500-400 MG-UNIT tablet Take by mouth.   Yes Historical Provider, MD  levothyroxine (SYNTHROID, LEVOTHROID) 50 MCG tablet TAKE ONE TABLET BY MOUTH ONCE DAILY. TAKE ON AN EMPTY STOMACH WITH A GLASS OF WATER AT LEAST 30-60 MINUTES BEFORE BREAKFAST 01/09/16   Yes Historical Provider, MD  losartan (COZAAR) 25 MG tablet TAKE ONE TABLET BY MOUTH ONCE DAILY 01/22/16  Yes Historical Provider, MD  metoprolol (LOPRESSOR) 50 MG tablet TAKE ONE TABLET BY MOUTH daily 07/26/15  Yes Historical Provider, MD  Multiple Vitamin (MULTI-VITAMINS) TABS Take by mouth.   Yes Historical Provider, MD  simvastatin (ZOCOR) 20 MG tablet Take by mouth. 06/08/15  Yes Historical Provider, MD    Allergies  Allergen Reactions  . Ampicillin   . Codeine   . Mevacor [Lovastatin]   . Valium [Diazepam]     Family History  Problem Relation Age of Onset  . Breast cancer Paternal Aunt 4090    Social History Social History  Substance Use Topics  . Smoking status: Never Smoker  . Smokeless tobacco: Never Used  . Alcohol use No    Review of Systems  Constitutional: Negative for fever. Eyes: Negative for visual changes. ENT: Negative for sore throat. Cardiovascular: Positive for chest pain. Respiratory: Negative for shortness of breath. Gastrointestinal: Negative for abdominal pain, vomiting and diarrhea. Genitourinary: Negative for dysuria. Musculoskeletal: Negative for back pain. Skin: Negative for rash. Neurological: Negative for headache. 10 point Review of Systems otherwise negative ____________________________________________   PHYSICAL EXAM:  VITAL SIGNS: ED Triage Vitals  Enc Vitals Group     BP 03/19/16 0738 (!) 165/75     Pulse Rate 03/19/16 0738 89     Resp 03/19/16 0738 17  Temp 03/19/16 0738 97.5 F (36.4 C)     Temp Source 03/19/16 0738 Oral     SpO2 03/19/16 0738 98 %     Weight 03/19/16 0739 145 lb (65.8 kg)     Height 03/19/16 0739 5\' 6"  (1.676 m)     Head Circumference --      Peak Flow --      Pain Score --      Pain Loc --      Pain Edu? --      Excl. in GC? --      Constitutional: Alert and oriented. Well appearing and in no distress. HEENT   Head: Normocephalic and atraumatic.      Eyes: Conjunctivae are normal. PERRL.  Normal extraocular movements.      Ears:         Nose: No congestion/rhinnorhea.   Mouth/Throat: Mucous membranes are moist.   Neck: No stridor. Cardiovascular/Chest: Normal rate, regular rhythm.  No murmurs, rubs, or gallops. Respiratory: Normal respiratory effort without tachypnea nor retractions. Breath sounds are clear and equal bilaterally. No wheezes/rales/rhonchi. Gastrointestinal: Soft. No distention, no guarding, no rebound. Nontender.    Genitourinary/rectal:Deferred Musculoskeletal: Nontender with normal range of motion in all extremities. No joint effusions.  No lower extremity tenderness.  No edema. Neurologic:  Normal speech and language. No gross or focal neurologic deficits are appreciated. Skin:  Skin is warm, dry and intact. No rash noted. Psychiatric: Mood and affect are normal. Speech and behavior are normal. Patient exhibits appropriate insight and judgment.   ____________________________________________  LABS (pertinent positives/negatives)  Labs Reviewed  BASIC METABOLIC PANEL - Abnormal; Notable for the following:       Result Value   Glucose, Bld 132 (*)    BUN 25 (*)    Creatinine, Ser 1.09 (*)    GFR calc non Af Amer 46 (*)    GFR calc Af Amer 53 (*)    All other components within normal limits  CBC - Abnormal; Notable for the following:    RDW 15.3 (*)    All other components within normal limits  TROPONIN I - Abnormal; Notable for the following:    Troponin I 0.24 (*)    All other components within normal limits    ____________________________________________    EKG I, Governor Rooksebecca Vincy Feliz, MD, the attending physician have personally viewed and interpreted all ECGs.  73 bpm. Normal sinus rhythm. Narrow QRS. Normal axis. Nonspecific ST and T-wave. There is minimal ST segment depression V2 through V4, no ST segment elevation, computerized printout indicated possible inferior ST segment elevation, which I am not in agreement with, there is some pr  depression. ____________________________________________  RADIOLOGY All Xrays were viewed by me. Imaging interpreted by Radiologist.  Chest xray 2 view:  IMPRESSION: No acute cardiopulmonary disease. __________________________________________  PROCEDURES  Procedure(s) performed: None  Critical Care performed: CRITICAL CARE Performed by: Governor RooksLORD, Jaylen Knope   Total critical care time: 30 minutes  Critical care time was exclusive of separately billable procedures and treating other patients.  Critical care was necessary to treat or prevent imminent or life-threatening deterioration.  Critical care was time spent personally by me on the following activities: development of treatment plan with patient and/or surrogate as well as nursing, discussions with consultants, evaluation of patient's response to treatment, examination of patient, obtaining history from patient or surrogate, ordering and performing treatments and interventions, ordering and review of laboratory studies, ordering and review of radiographic studies, pulse oximetry and re-evaluation of patient's  condition.   ____________________________________________   ED COURSE / ASSESSMENT AND PLAN  Pertinent labs & imaging results that were available during my care of the patient were reviewed by me and considered in my medical decision making (see chart for details).   Ms Dilley came for an atypical left-sided chest discomfort which initially she told me was gone, and her EKG is nonspecific and symptoms were not associated with any additional symptoms concerning for cardiac etiology. However, no other clear source such as indigestion or shingles or muscle, or pneumonia, etc.   Troponin came back elevated 0.24. Given this and some minimal ST segment depression anteriorly, and in discussion with the patient a tablet she is having some vague discomfort ongoing, I will place the patient on heparin bolus and drip. She also received  nitroglycerin paste as well as aspirin.  I spoke with on-call cardiologist Dr. Okey Dupre, in agreement with plan, he will see patient in the hospital.  Hospitalist for admission.    CONSULTATIONS:  Hospitalist for admission.  Phone consultation with cardiologist.   Patient / Family / Caregiver informed of clinical course, medical decision-making process, and agree with plan.    ___________________________________________   FINAL CLINICAL IMPRESSION(S) / ED DIAGNOSES   Final diagnoses:  Troponin I above reference range  Unstable angina [facility])              Note: This dictation was prepared with Dragon dictation. Any transcriptional errors that result from this process are unintentional    Governor Rooks, MD 03/19/16 0900

## 2016-03-19 NOTE — Interval H&P Note (Signed)
History and Physical Interval Note:  03/19/2016 11:18 AM  Alyssa Christensen  has presented today for cardiac catheterization, with the diagnosis of NSTEMI.  The various methods of treatment have been discussed with the patient and family. After consideration of risks, benefits and other options for treatment, the patient has consented to  Procedure(s): Left Heart Cath and Coronary Angiography (N/A) as a surgical intervention .  The patient's history has been reviewed, patient examined, no change in status, stable for surgery.  I have reviewed the patient's chart and labs.  Questions were answered to the patient's satisfaction.    4Cath Lab Visit (complete for each Cath Lab visit)  Clinical Evaluation Leading to the Procedure:   ACS: Yes.    Non-ACS: N/A   Firman Petrow

## 2016-03-19 NOTE — Progress Notes (Signed)
Admitted from ED with Chest Pain and elevated troponin.  Denies pain at this time.  Awake and alert, pleasant woman. Oriented to unit routines and room equipment.

## 2016-03-19 NOTE — Progress Notes (Signed)
ANTICOAGULATION CONSULT NOTE - Initial Consult  Pharmacy Consult for Heparin Drip Indication: chest pain/ACS  Allergies  Allergen Reactions  . Ampicillin   . Codeine   . Mevacor [Lovastatin]   . Valium [Diazepam]     Patient Measurements: Height: 5\' 6"  (167.6 cm) Weight: 145 lb (65.8 kg) IBW/kg (Calculated) : 59.3 Heparin Dosing Weight: 65.8 kg  Vital Signs: Temp: 97.5 F (36.4 C) (11/14 0738) Temp Source: Oral (11/14 0738) BP: 141/76 (11/14 0930) Pulse Rate: 84 (11/14 0930)  Labs:  Recent Labs  03/19/16 0748  HGB 13.4  HCT 39.8  PLT 173  APTT 27  LABPROT 11.8  INR 0.87  CREATININE 1.09*  TROPONINI 0.24*    Estimated Creatinine Clearance: 36.6 mL/min (by C-G formula based on SCr of 1.09 mg/dL (H)).   Medical History: Past Medical History:  Diagnosis Date  . Allergy   . CHF (congestive heart failure) (HCC)   . Depression   . Osteoporosis     Medications:  Scheduled:  . [START ON 03/20/2016] aspirin  81 mg Oral Daily  . atorvastatin  40 mg Oral q1800  . calcium citrate-vitamin D  1 tablet Oral BID  . [START ON 03/20/2016] levothyroxine  50 mcg Oral QAC breakfast  . [START ON 03/20/2016] losartan  25 mg Oral Daily  . metoprolol tartrate  25 mg Oral BID  . potassium chloride  40 mEq Oral Once  . sodium chloride flush  3 mL Intravenous Q12H   Infusions:  . heparin 750 Units/hr (03/19/16 0947)    Assessment: 82 y.o. female presents to the hospital with chest pain and borderline troponin concerning for ACS   Goal of Therapy:  Heparin level 0.3-0.7 units/ml Monitor platelets by anticoagulation protocol: Yes   Plan:  Give 3950 units bolus x 1 Start heparin infusion at 750 units/hr Check anti-Xa level in 8 hours and daily while on heparin Continue to monitor H&H and platelets   HL ordered for 11/14 at 1800.  Aldea Avis K, RPh 03/19/2016,10:21 AM

## 2016-03-19 NOTE — Progress Notes (Signed)
Report to taylor  Telemetry.  Keep right wrist elevated on pillow above the heart for today.  Watch right wrist for evidence of bleeding or hematoma.. If bleeding or hematoma noted, hold pressure over the site for at least 15 minutes and notify the physician.  No blending or flexing of the wrist--no lifting for the remainder of the day or for 2 weeks after your procedure. Notify the physician for evidence of infection or if you get a temperature.

## 2016-03-19 NOTE — H&P (Signed)
[facility] [place] [facility]   PATIENT NAME: Alyssa Christensen    MR#:  [id number]  DATE OF BIRTH:  1930-09-20  DATE OF ADMISSION:  03/19/2016  PRIMARY CARE PHYSICIAN: Barbette Reichmann, MD   REQUESTING/REFERRING PHYSICIAN: Governor Rooks  CHIEF COMPLAINT:   Chief Complaint  Patient presents with  . Chest Pain    HISTORY OF PRESENT ILLNESS:  Alyssa Christensen  is a 82 y.o. female presents to the hospital with chest pain that woke her up from sleep. She tried to go back to sleep for a little while but didn't really sleep and then since the pain was still going on she called EMS. Pain described as not to severe 5 out of 10 in intensity in the center of her chest. She does only have some soreness on her chest at this point. No complaints of shortness of breath with this episode but she does have shortness of breath with exertion. No nausea or diaphoresis.  In the ER, she was found to have a borderline troponin and hospitalist services were contacted for further evaluation. The ER physician already called [facility] cardiology and they saw the patient before I did.  PAST MEDICAL HISTORY:   Past Medical History:  Diagnosis Date  . Allergy   . CHF (congestive heart failure) (HCC)   . Depression   . Osteoporosis     PAST SURGICAL HISTORY:   Past Surgical History:  Procedure Laterality Date  . BREAST BIOPSY Bilateral    neg  . BREAST SURGERY     "to remove nodules that turned out to be benign"  . foot surgeries     bilaterally to remove bunions  . varicose vein "stripped"     pt wears compression stocking now    SOCIAL HISTORY:   Social History  Substance Use Topics  . Smoking status: Never Smoker  . Smokeless tobacco: Never Used  . Alcohol use No    FAMILY HISTORY:   Family History  Problem Relation Age of Onset  . Breast cancer Paternal Aunt 20  . Cancer Mother   . Cancer Father     DRUG ALLERGIES:   Allergies  Allergen Reactions  . Ampicillin    . Codeine   . Mevacor [Lovastatin]   . Valium [Diazepam]     REVIEW OF SYSTEMS:  CONSTITUTIONAL: No fever. Some fatigue. Positive for chills EYES: No blurred or double vision. Wears glasses EARS, NOSE, AND THROAT: No tinnitus or ear pain. No sore throat. Positive for runny nose RESPIRATORY: Positive for shortness of breath with exertion only. No cough, wheezing or hemoptysis.  CARDIOVASCULAR: Positive for chest pain. No orthopnea, edema.  GASTROINTESTINAL: No nausea, vomiting, diarrhea or abdominal pain. No blood in bowel movements. History of constipation GENITOURINARY: No dysuria, hematuria.  ENDOCRINE: No polyuria, nocturia, positive for hypothyroidism HEMATOLOGY: No anemia, easy bruising or bleeding SKIN: No rash or lesion. MUSCULOSKELETAL: Positive for arthritis NEUROLOGIC: No tingling, numbness, weakness.  PSYCHIATRY: Positive for depression because she lost her husband last winter.   MEDICATIONS AT HOME:   Prior to Admission medications   Medication Sig Start Date End Date Taking? Authorizing Provider  aspirin 81 MG chewable tablet Chew 81 mg by mouth daily.   Yes Historical Provider, MD  calcium-vitamin D (SM CALCIUM 500/VITAMIN D3) 500-400 MG-UNIT tablet Take by mouth.   Yes Historical Provider, MD  levothyroxine (SYNTHROID, LEVOTHROID) 50 MCG tablet TAKE ONE TABLET BY MOUTH ONCE DAILY. TAKE ON AN EMPTY STOMACH WITH A GLASS OF WATER  AT LEAST 30-60 MINUTES BEFORE BREAKFAST 01/09/16  Yes Historical Provider, MD  losartan (COZAAR) 25 MG tablet TAKE ONE TABLET BY MOUTH ONCE DAILY 01/22/16  Yes Historical Provider, MD  metoprolol (LOPRESSOR) 50 MG tablet TAKE ONE TABLET BY MOUTH daily 07/26/15  Yes Historical Provider, MD  Multiple Vitamin (MULTI-VITAMINS) TABS Take by mouth.   Yes Historical Provider, MD  simvastatin (ZOCOR) 20 MG tablet Take by mouth. 06/08/15  Yes Historical Provider, MD      VITAL SIGNS:  Blood pressure (!) 141/76, pulse 84, temperature 97.5 F (36.4 C),  temperature source Oral, resp. rate 16, height 5\' 6"  (1.676 m), weight 65.8 kg (145 lb), SpO2 97 %.  PHYSICAL EXAMINATION:  GENERAL:  82 y.o.-year-old patient lying in the bed with no acute distress.  EYES: Pupils equal, round, reactive to light and accommodation. No scleral icterus. Extraocular muscles intact.  HEENT: Head atraumatic, normocephalic. Oropharynx and nasopharynx clear.  NECK:  Supple, no jugular venous distention. No thyroid enlargement, no tenderness.  LUNGS: Normal breath sounds bilaterally, no wheezing, rales,rhonchi or crepitation. No use of accessory muscles of respiration.  CARDIOVASCULAR: S1, S2 normal. No murmurs, rubs, or gallops.  ABDOMEN: Soft, nontender, nondistended. Bowel sounds present. No organomegaly or mass.  EXTREMITIES: No pedal edema, cyanosis, or clubbing.  NEUROLOGIC: Cranial nerves II through XII are intact. Muscle strength 5/5 in all extremities. Sensation intact. Gait not checked.  PSYCHIATRIC: The patient is alert and oriented x 3.  SKIN: No rash, lesion, or ulcer.   LABORATORY PANEL:   CBC  Recent Labs Lab 03/19/16 0748  WBC 6.2  HGB 13.4  HCT 39.8  PLT 173   ------------------------------------------------------------------------------------------------------------------  Chemistries   Recent Labs Lab 03/19/16 0748  NA 141  K 3.5  CL 104  CO2 27  GLUCOSE 132*  BUN 25*  CREATININE 1.09*  CALCIUM 9.9   ------------------------------------------------------------------------------------------------------------------  Cardiac Enzymes  Recent Labs Lab 03/19/16 0748  TROPONINI 0.24*   ------------------------------------------------------------------------------------------------------------------  RADIOLOGY:  Dg Chest 2 View  Result Date: 03/19/2016 CLINICAL DATA:  Patient reports left side chest pain onset this morning. Pain has since resolved. Hx CHF, COPD, HTN, breast biopsy. Non-smoker. EXAM: CHEST  2 VIEW  COMPARISON:  10/01/2011 FINDINGS: The cardiac silhouette is normal in size and configuration. Normal mediastinal and hilar contours. Lungs are mildly hyperexpanded but clear. No pleural effusion or pneumothorax. Skeletal structures are demineralized but intact. IMPRESSION: No acute cardiopulmonary disease. Electronically Signed   By: Amie Portlandavid  Ormond M.D.   On: 03/19/2016 08:18    EKG:   Normal sinus rhythm 73 bpm, left atrial enlargement.  IMPRESSION AND PLAN:   1. Chest pain and borderline troponin concerning for ACS. [facility] by the ER physician and they will set up for cardiac catheter today, they already saw the patient prior to me seeing the patient. Nothing by mouth. Heparin drip, aspirin, statin and metoprolol. Nitropaste ordered by ER physician. Observe on telemetry. Serial cardiac enzymes. 2. Hyperlipidemia unspecified. Switch Zocor to Lipitor and check a lipid profile in the a.m. 3. Hypothyroidism unspecified continue levothyroxine 4. Essential hypertension continue metoprolol and Cozaar 5. History of CHF. Currently no signs. 6. Depression on no medication for this. Can consider medication as outpatient with Dr. Marcello FennelHande.  All the records are reviewed and case discussed with ED provider. Management plans discussed with the patient, family and they are in agreement.  CODE STATUS: Full code  TOTAL TIME TAKING CARE OF THIS PATIENT: 55 minutes.    Alford HighlandWIETING, Hava Massingale  M.D on 03/19/2016 at 10:02 AM  Between 7am to 6pm - Pager - [phone]  After 6pm call admission pager [phone]  [facility] Office  [phone]  CC: Primary care physician; Barbette ReichmannHANDE,VISHWANATH, MD

## 2016-03-19 NOTE — Progress Notes (Signed)
To cardiac cath

## 2016-03-20 ENCOUNTER — Encounter: Payer: Self-pay | Admitting: Internal Medicine

## 2016-03-20 DIAGNOSIS — Z888 Allergy status to other drugs, medicaments and biological substances status: Secondary | ICD-10-CM | POA: Diagnosis not present

## 2016-03-20 DIAGNOSIS — I214 Non-ST elevation (NSTEMI) myocardial infarction: Secondary | ICD-10-CM | POA: Diagnosis present

## 2016-03-20 DIAGNOSIS — Z809 Family history of malignant neoplasm, unspecified: Secondary | ICD-10-CM | POA: Diagnosis not present

## 2016-03-20 DIAGNOSIS — Z79899 Other long term (current) drug therapy: Secondary | ICD-10-CM | POA: Diagnosis not present

## 2016-03-20 DIAGNOSIS — Z9889 Other specified postprocedural states: Secondary | ICD-10-CM | POA: Diagnosis not present

## 2016-03-20 DIAGNOSIS — Z7982 Long term (current) use of aspirin: Secondary | ICD-10-CM | POA: Diagnosis not present

## 2016-03-20 DIAGNOSIS — I25110 Atherosclerotic heart disease of native coronary artery with unstable angina pectoris: Secondary | ICD-10-CM | POA: Diagnosis present

## 2016-03-20 DIAGNOSIS — N6019 Diffuse cystic mastopathy of unspecified breast: Secondary | ICD-10-CM | POA: Diagnosis present

## 2016-03-20 DIAGNOSIS — I110 Hypertensive heart disease with heart failure: Secondary | ICD-10-CM | POA: Diagnosis present

## 2016-03-20 DIAGNOSIS — I5181 Takotsubo syndrome: Secondary | ICD-10-CM | POA: Diagnosis present

## 2016-03-20 DIAGNOSIS — Z880 Allergy status to penicillin: Secondary | ICD-10-CM | POA: Diagnosis not present

## 2016-03-20 DIAGNOSIS — E039 Hypothyroidism, unspecified: Secondary | ICD-10-CM | POA: Diagnosis present

## 2016-03-20 DIAGNOSIS — Z803 Family history of malignant neoplasm of breast: Secondary | ICD-10-CM | POA: Diagnosis not present

## 2016-03-20 DIAGNOSIS — M810 Age-related osteoporosis without current pathological fracture: Secondary | ICD-10-CM | POA: Diagnosis present

## 2016-03-20 DIAGNOSIS — I5022 Chronic systolic (congestive) heart failure: Secondary | ICD-10-CM | POA: Diagnosis present

## 2016-03-20 DIAGNOSIS — E785 Hyperlipidemia, unspecified: Secondary | ICD-10-CM | POA: Diagnosis present

## 2016-03-20 DIAGNOSIS — E876 Hypokalemia: Secondary | ICD-10-CM | POA: Diagnosis present

## 2016-03-20 DIAGNOSIS — I200 Unstable angina: Secondary | ICD-10-CM | POA: Diagnosis present

## 2016-03-20 DIAGNOSIS — E0590 Thyrotoxicosis, unspecified without thyrotoxic crisis or storm: Secondary | ICD-10-CM | POA: Diagnosis present

## 2016-03-20 DIAGNOSIS — I340 Nonrheumatic mitral (valve) insufficiency: Secondary | ICD-10-CM | POA: Diagnosis present

## 2016-03-20 DIAGNOSIS — Z886 Allergy status to analgesic agent status: Secondary | ICD-10-CM | POA: Diagnosis not present

## 2016-03-20 DIAGNOSIS — Z7902 Long term (current) use of antithrombotics/antiplatelets: Secondary | ICD-10-CM | POA: Diagnosis not present

## 2016-03-20 LAB — HEPARIN LEVEL (UNFRACTIONATED)
HEPARIN UNFRACTIONATED: 0.42 [IU]/mL (ref 0.30–0.70)
HEPARIN UNFRACTIONATED: 0.68 [IU]/mL (ref 0.30–0.70)
Heparin Unfractionated: 0.56 IU/mL (ref 0.30–0.70)

## 2016-03-20 LAB — BASIC METABOLIC PANEL
ANION GAP: 6 (ref 5–15)
BUN: 19 mg/dL (ref 6–20)
CALCIUM: 8.8 mg/dL — AB (ref 8.9–10.3)
CO2: 26 mmol/L (ref 22–32)
Chloride: 108 mmol/L (ref 101–111)
Creatinine, Ser: 0.94 mg/dL (ref 0.44–1.00)
GFR calc non Af Amer: 55 mL/min — ABNORMAL LOW (ref >60)
Glucose, Bld: 92 mg/dL (ref 65–99)
Potassium: 3.9 mmol/L (ref 3.5–5.1)
Sodium: 140 mmol/L (ref 135–145)

## 2016-03-20 LAB — LIPID PANEL
CHOLESTEROL: 168 mg/dL (ref 0–200)
HDL: 87 mg/dL (ref >40)
LDL Cholesterol: 72 mg/dL (ref 0–99)
Total CHOL/HDL Ratio: 1.9 RATIO
Triglycerides: 46 mg/dL (ref <150)
VLDL: 9 mg/dL (ref 0–40)

## 2016-03-20 LAB — CBC
HCT: 33.9 % — ABNORMAL LOW (ref 35.0–47.0)
HEMOGLOBIN: 11.6 g/dL — AB (ref 12.0–16.0)
MCH: 31.8 pg (ref 26.0–34.0)
MCHC: 34.2 g/dL (ref 32.0–36.0)
MCV: 93 fL (ref 80.0–100.0)
Platelets: 147 10*3/uL — ABNORMAL LOW (ref 150–440)
RBC: 3.65 MIL/uL — AB (ref 3.80–5.20)
RDW: 14.8 % — ABNORMAL HIGH (ref 11.5–14.5)
WBC: 5.2 10*3/uL (ref 3.6–11.0)

## 2016-03-20 LAB — ECHOCARDIOGRAM COMPLETE
HEIGHTINCHES: 66 in
Weight: 2347.2 oz

## 2016-03-20 LAB — TROPONIN I: Troponin I: 13.07 ng/mL (ref <0.03)

## 2016-03-20 LAB — HEMOGLOBIN A1C
Hgb A1c MFr Bld: 5.9 % — ABNORMAL HIGH (ref 4.8–5.6)
MEAN PLASMA GLUCOSE: 123 mg/dL

## 2016-03-20 MED ORDER — CLOPIDOGREL BISULFATE 75 MG PO TABS
300.0000 mg | ORAL_TABLET | Freq: Once | ORAL | Status: AC
  Administered 2016-03-20: 300 mg via ORAL
  Filled 2016-03-20: qty 4

## 2016-03-20 MED ORDER — CLOPIDOGREL BISULFATE 75 MG PO TABS
75.0000 mg | ORAL_TABLET | Freq: Every day | ORAL | Status: DC
  Administered 2016-03-21: 75 mg via ORAL
  Filled 2016-03-20: qty 1

## 2016-03-20 MED ORDER — POTASSIUM CHLORIDE CRYS ER 10 MEQ PO TBCR
10.0000 meq | EXTENDED_RELEASE_TABLET | Freq: Once | ORAL | Status: AC
Start: 2016-03-20 — End: 2016-03-20
  Administered 2016-03-20: 10 meq via ORAL
  Filled 2016-03-20: qty 1

## 2016-03-20 MED ORDER — SODIUM CHLORIDE 0.9 % IV BOLUS (SEPSIS)
500.0000 mL | Freq: Once | INTRAVENOUS | Status: AC
  Administered 2016-03-20: 500 mL via INTRAVENOUS

## 2016-03-20 NOTE — Progress Notes (Signed)
Pt complaining of facial tightness, and numbness around her mouth. Neurologically intact. VSS. Pt states that it went away after a few minutes, and she feels fine. MD Pyreddy paged. Per MD we will continue to monitor her. Will continue to monitor.   Alyssa Christensen, Alyssa Christensen

## 2016-03-20 NOTE — Progress Notes (Signed)
Patient ID: Alyssa Christensen, female   DOB: 1931/03/24, 82 y.o.   MRN: [id number]  [facility] PROGRESS NOTE  Alyssa Christensen FAO:[id number] DOB: 1931/03/24 DOA: 03/19/2016 PCP: Barbette ReichmannHANDE,VISHWANATH, MD  HPI/Subjective: Patient feels well today. No complaints of chest pain or shortness breath. Currently on heparin drip as per cardiology.  Objective: Vitals:   03/20/16 0503 03/20/16 1107  BP: (!) 130/55 (!) 133/58  Pulse: 62 60  Resp:  16  Temp:  97.7 F (36.5 C)    Filed Weights   03/19/16 0739 03/19/16 1112  Weight: 65.8 kg (145 lb) 66.5 kg (146 lb 11.2 oz)    ROS: Review of Systems  Constitutional: Negative for chills and fever.  Eyes: Negative for blurred vision.  Respiratory: Negative for cough and shortness of breath.   Cardiovascular: Negative for chest pain.  Gastrointestinal: Negative for abdominal pain, constipation, diarrhea, nausea and vomiting.  Genitourinary: Negative for dysuria.  Musculoskeletal: Negative for joint pain.  Neurological: Negative for dizziness and headaches.   Exam: Physical Exam  Constitutional: She is oriented to person, place, and time.  HENT:  Nose: No mucosal edema.  Mouth/Throat: No oropharyngeal exudate or posterior oropharyngeal edema.  Eyes: Conjunctivae, EOM and lids are normal. Pupils are equal, round, and reactive to light.  Neck: No JVD present. Carotid bruit is not present. No edema present. No thyroid mass and no thyromegaly present.  Cardiovascular: S1 normal and S2 normal.  Exam reveals no gallop.   No murmur heard. Pulses:      Dorsalis pedis pulses are 2+ on the right side, and 2+ on the left side.  Respiratory: No respiratory distress. She has no wheezes. She has no rhonchi. She has no rales.  GI: Soft. Bowel sounds are normal. There is no tenderness.  Musculoskeletal:       Right ankle: She exhibits no swelling.       Left ankle: She exhibits no swelling.  Lymphadenopathy:    She has no cervical adenopathy.   Neurological: She is alert and oriented to person, place, and time. No cranial nerve deficit.  Skin: Skin is warm. No rash noted. Nails show no clubbing.  Psychiatric: She has a normal mood and affect.      Data Reviewed: Basic Metabolic Panel:  Recent Labs Lab 03/19/16 0748 03/19/16 1117 03/20/16 0346  NA 141  --  140  K 3.5  --  3.9  CL 104  --  108  CO2 27  --  26  GLUCOSE 132*  --  92  BUN 25*  --  19  CREATININE 1.09*  --  0.94  CALCIUM 9.9  --  8.8*  MG  --  2.0  --    CBC:  Recent Labs Lab 03/19/16 0748 03/20/16 0346  WBC 6.2 5.2  HGB 13.4 11.6*  HCT 39.8 33.9*  MCV 93.6 93.0  PLT 173 147*   Cardiac Enzymes:  Recent Labs Lab 03/19/16 0748 03/19/16 1117 03/19/16 2133 03/20/16 0346  TROPONINI 0.24* 2.99* 18.10* 13.07*     Studies: Dg Chest 2 View  Result Date: 03/19/2016 CLINICAL DATA:  Patient reports left side chest pain onset this morning. Pain has since resolved. Hx CHF, COPD, HTN, breast biopsy. Non-smoker. EXAM: CHEST  2 VIEW COMPARISON:  10/01/2011 FINDINGS: The cardiac silhouette is normal in size and configuration. Normal mediastinal and hilar contours. Lungs are mildly hyperexpanded but clear. No pleural effusion or pneumothorax. Skeletal structures are demineralized but intact. IMPRESSION: No acute cardiopulmonary disease. Electronically Signed  By: Amie Portlandavid  Ormond M.D.   On: 03/19/2016 08:18    Scheduled Meds: . aspirin  81 mg Oral Daily  . atorvastatin  40 mg Oral q1800  . calcium citrate-vitamin D  1 tablet Oral BID  . [START ON 03/21/2016] clopidogrel  75 mg Oral Daily  . levothyroxine  50 mcg Oral QAC breakfast  . losartan  25 mg Oral Daily  . metoprolol tartrate  25 mg Oral BID  . sodium chloride flush  3 mL Intravenous Q12H  . sodium chloride flush  3 mL Intravenous Q12H   Continuous Infusions: . heparin 700 Units/hr (03/20/16 1252)    Assessment/Plan:  1. NSTEMI. Cardiac catheterization negative. Potential spasm or  torturous artery. Continue aspirin, metoprolol and atorvastatin. Plavix added. Heparin drip for another 24 hours as per cardiology 2. Essential hypertension continue usual medications 3. Hyperlipidemia unspecified on atorvastatin 4. Hyperthyroidism unspecified on levothyroxine   Code Status:     Code Status Orders        Start     Ordered   03/19/16 1000  Full code  Continuous     03/19/16 0959    Code Status History    Date Active Date Inactive Code Status Order ID Comments User Context   This patient has a current code status but no historical code status.     Disposition Plan: Potential home tomorrow  Consultants:  Cardiology  Time spent: 24 minutes  Alford HighlandWIETING, Tashana Haberl  [employer]

## 2016-03-20 NOTE — Care Management (Signed)
Patient status inpatient as of 11.15 after ruling in for nstemi.  It is not anticipated that she will discharge home on cost prohibitive anti platelet medication

## 2016-03-20 NOTE — Progress Notes (Signed)
500ml bolus finished, rechecked vitals, you can see below... BP did not come up much, MD paged again, to see whether to give the metoprolol or not, per Dr. Anne HahnWillis hold tonight's dose of metoprolol. Will continue to monitor pt. Shirley FriarAlexis Miller, RN, BSN  Vitals:   03/20/16 2107 03/20/16 2229  BP: (!) 100/49 (!) 108/56  Pulse: 71 73  Resp:    Temp:

## 2016-03-20 NOTE — Progress Notes (Signed)
Patient: Alyssa Christensen / Admit Date: 03/19/2016 / Date of Encounter: 03/20/2016, 8:06 AM   Subjective: Status post cardiac cath on 11/14 that showed mild to moderate nonobstructive CAD including 30% LMCA (possibly overestimated given kinking of vessel). No culprit lesion was identified. No further chest pain. Did have a brief episode of perioral tightness this morning without chest pain, neck pain, or jaw pain. No focal deficits or slurred speech. Echo is pending at this time. Has been medically managed and remains on heparin gtt. Troponin peaked at 18.   Review of Systems: Review of Systems  Constitutional: Positive for malaise/fatigue. Negative for chills, diaphoresis, fever and weight loss.  HENT: Negative for congestion.   Eyes: Negative for discharge and redness.  Respiratory: Negative for cough, hemoptysis, sputum production, shortness of breath and wheezing.   Cardiovascular: Negative for chest pain, palpitations, orthopnea, claudication, leg swelling and PND.  Gastrointestinal: Negative for abdominal pain, blood in stool, heartburn, melena, nausea and vomiting.  Genitourinary: Negative for hematuria.  Musculoskeletal: Negative for falls and myalgias.  Skin: Negative for rash.  Neurological: Positive for tingling. Negative for dizziness, tremors, sensory change, speech change, focal weakness, loss of consciousness and weakness.  Endo/Heme/Allergies: Does not bruise/bleed easily.  Psychiatric/Behavioral: Negative for substance abuse. The patient is not nervous/anxious.   All other systems reviewed and are negative.   Objective: Telemetry: NSR, 60's bpm Physical Exam: Blood pressure (!) 130/55, pulse 62, temperature 97.8 F (36.6 C), temperature source Oral, resp. rate 17, height 5\' 6"  (1.676 m), weight 146 lb 11.2 oz (66.5 kg), SpO2 96 %. Body mass index is 23.68 kg/m. General: Well developed, well nourished, in no acute distress. Head: Normocephalic, atraumatic, sclera  non-icteric, no xanthomas, nares are without discharge. Neck: Negative for carotid bruits. JVP not elevated. Lungs: Clear bilaterally to auscultation without wheezes, rales, or rhonchi. Breathing is unlabored. Heart: RRR S1 S2, II/VI systolic murmur, no rubs, or gallops.  Abdomen: Soft, non-tender, non-distended with normoactive bowel sounds. No rebound/guarding. Extremities: No clubbing or cyanosis. No edema. Distal pedal pulses are 2+ and equal bilaterally. Neuro: Alert and oriented X 3. Moves all extremities spontaneously. No focal deficits.  Psych:  Responds to questions appropriately with a normal affect.   Intake/Output Summary (Last 24 hours) at 03/20/16 0806 Last data filed at 03/20/16 0430  Gross per 24 hour  Intake            507.5 ml  Output             1650 ml  Net          -1142.5 ml    Inpatient Medications:  . aspirin  81 mg Oral Daily  . atorvastatin  40 mg Oral q1800  . calcium citrate-vitamin D  1 tablet Oral BID  . clopidogrel  300 mg Oral Once  . [START ON 03/21/2016] clopidogrel  75 mg Oral Daily  . levothyroxine  50 mcg Oral QAC breakfast  . losartan  25 mg Oral Daily  . metoprolol tartrate  25 mg Oral BID  . sodium chloride flush  3 mL Intravenous Q12H  . sodium chloride flush  3 mL Intravenous Q12H   Infusions:  . heparin 750 Units/hr (03/19/16 2100)    Labs:  Recent Labs  03/19/16 0748 03/19/16 1117 03/20/16 0346  NA 141  --  140  K 3.5  --  3.9  CL 104  --  108  CO2 27  --  26  GLUCOSE 132*  --  92  BUN 25*  --  19  CREATININE 1.09*  --  0.94  CALCIUM 9.9  --  8.8*  MG  --  2.0  --    No results for input(s): AST, ALT, ALKPHOS, BILITOT, PROT, ALBUMIN in the last 72 hours.  Recent Labs  03/19/16 0748 03/20/16 0346  WBC 6.2 5.2  HGB 13.4 11.6*  HCT 39.8 33.9*  MCV 93.6 93.0  PLT 173 147*    Recent Labs  03/19/16 0748 03/19/16 1117 03/19/16 2133 03/20/16 0346  TROPONINI 0.24* 2.99* 18.10* 13.07*   Invalid input(s):  POCBNP  Recent Labs  03/19/16 1117  HGBA1C 5.9*     Weights: Filed Weights   03/19/16 0739 03/19/16 1112  Weight: 145 lb (65.8 kg) 146 lb 11.2 oz (66.5 kg)     Radiology/Studies:  Dg Chest 2 View  Result Date: 03/19/2016 CLINICAL DATA:  Patient reports left side chest pain onset this morning. Pain has since resolved. Hx CHF, COPD, HTN, breast biopsy. Non-smoker. EXAM: CHEST  2 VIEW COMPARISON:  10/01/2011 FINDINGS: The cardiac silhouette is normal in size and configuration. Normal mediastinal and hilar contours. Lungs are mildly hyperexpanded but clear. No pleural effusion or pneumothorax. Skeletal structures are demineralized but intact. IMPRESSION: No acute cardiopulmonary disease. Electronically Signed   By: Amie Portlandavid  Ormond M.D.   On: 03/19/2016 08:18     Assessment and Plan  Principal Problem:   NSTEMI (non-ST elevated myocardial infarction) [facility](HCC) Active Problems:   Chest pain   Essential hypertension   Hypothyroidism   Hypokalemia   HLD (hyperlipidemia)   Systolic dysfunction   Osteoporosis   Fibrocystic breast disease    1. NSTEMI: -Status post cardiac cath on 11/14 that did not show culprit lesion -Cannot rule out possibility of coronary vasospasm vs completely occluded small branch that may have been too small for PCI vs transient occlusion that was resolved with medical management prior to cardiac cath -Continue heparin gtt for total of 48 hours (will stop on 03/21/16) -Load with Plavix 300 mg today and start Plavix 75 mg daily on 03/21/16 -Continue ASA 81 mg daily, Lopressor, losartan, and Lipitor  -No further chest pain. Should she develop recurrent chest pain could add Imdur -Echo pending -Recheck 12-lead EKG today -Plan for outpatient cardiac rehab  2. Systolic dysfunction: -She does not appear to be volume overloaded -Await echo -Continue beta blocker and losartan  -No indication for standing Lasix at this time  3. Perioral  tightness/paraesthesia: -She is not certain she felt this or if it was in her head as she read about this previously -Has had similar sensation previously  -Resolved -Monitor -Neurologically intact -No indication for imaging at this time  4. Accelerated HTN: -Improved -Continue current medications  5. Hypothyroidism: -On replacement therapy  6. Hypokalemia: -Repleted   7. HLD: -Lipitor 40 mg daily  8. Fibrocystic breast disease: -Follow up with PCP    Signed, Eula Listenyan Dunn, PA-C [facility] Pager: [phone] 03/20/2016, 8:06 AM

## 2016-03-20 NOTE — Progress Notes (Signed)
Pt to get 25mg  PO metoprolol, however vitals are as follows  Below.... MD paged, Dr. Anne HahnWillis to put in orders for a 500ml blous to run over an hour & then recheck pressure to see if it comes up enough to give the metoprolol since pt is being treated for an NSTEMI. Will give blous & recheck vitals. Shirley FriarAlexis Miller, RN, BSN  Vitals:   03/20/16 1959 03/20/16 2107  BP: (!) 109/42 (!) 100/49  Pulse: 76 71  Resp: 18   Temp: 97.7 F (36.5 C)

## 2016-03-20 NOTE — Progress Notes (Signed)
ANTICOAGULATION CONSULT NOTE - Initial Consult  Pharmacy Consult for Heparin  Indication: chest pain/ACS  Allergies  Allergen Reactions  . Ampicillin   . Codeine   . Mevacor [Lovastatin]   . Valium [Diazepam]     Patient Measurements: Height: 5\' 6"  (167.6 cm) Weight: 146 lb 11.2 oz (66.5 kg) IBW/kg (Calculated) : 59.3 Heparin Dosing Weight: 65.8 kg  Vital Signs: Temp: 97.7 F (36.5 C) (11/15 1107) Temp Source: Oral (11/15 1107) BP: 133/58 (11/15 1107) Pulse Rate: 60 (11/15 1107)  Labs:  Recent Labs  03/19/16 0748 03/19/16 1117 03/19/16 2133 03/20/16 0346 03/20/16 1156  HGB 13.4  --   --  11.6*  --   HCT 39.8  --   --  33.9*  --   PLT 173  --   --  147*  --   APTT 27  --   --   --   --   LABPROT 11.8  --   --   --   --   INR 0.87  --   --   --   --   HEPARINUNFRC  --   --   --  0.42 0.68  CREATININE 1.09*  --   --  0.94  --   TROPONINI 0.24* 2.99* 18.10* 13.07*  --     Estimated Creatinine Clearance: 42.5 mL/min (by C-G formula based on SCr of 0.94 mg/dL).   Medical History: Past Medical History:  Diagnosis Date  . Allergy   . Depression   . Essential hypertension   . Fibrocystic breast disease   . HLD (hyperlipidemia)   . Hypothyroidism   . Ischemic bowel disease (HCC) 2011  . Osteoporosis   . Systolic dysfunction    a. PCP with reported echo from 2010 showing EF 40-45%, mod MR; b. pt reported nuc following 2010 echo was normal    Medications:  Scheduled:  . aspirin  81 mg Oral Daily  . atorvastatin  40 mg Oral q1800  . calcium citrate-vitamin D  1 tablet Oral BID  . [START ON 03/21/2016] clopidogrel  75 mg Oral Daily  . levothyroxine  50 mcg Oral QAC breakfast  . losartan  25 mg Oral Daily  . metoprolol tartrate  25 mg Oral BID  . sodium chloride flush  3 mL Intravenous Q12H  . sodium chloride flush  3 mL Intravenous Q12H    Assessment: Pharmacy consulted to dose heparin in this 82 year old female.  MD wants drip started 4 hrs after TR  band removal.  Band was removed on 11/14 @ 1314. Pt was already on heparin 5000 units SQ Q8H.  CrCl = 36.6 ml/min.  Goal of Therapy:  Heparin level 0.3-0.7 units/ml Monitor platelets by anticoagulation protocol: Yes   Plan:  Heparin level therapeutic at 0.68, however jumped from 0.48. Will decrease rate slightly to avoid going supra therapeutic. Recheck level in 8 hours. Continue heparin drip for a total of 48 hours per cardiology.     Melissa D Maccia 03/20/2016,12:53 PM

## 2016-03-20 NOTE — Progress Notes (Signed)
ANTICOAGULATION CONSULT NOTE - Initial Consult  Pharmacy Consult for Heparin  Indication: chest pain/ACS  Allergies  Allergen Reactions  . Ampicillin   . Codeine   . Mevacor [Lovastatin]   . Valium [Diazepam]     Patient Measurements: Height: 5\' 6"  (167.6 cm) Weight: 146 lb 11.2 oz (66.5 kg) IBW/kg (Calculated) : 59.3 Heparin Dosing Weight: 65.8 kg  Vital Signs: Temp: 97.7 F (36.5 C) (11/15 1959) Temp Source: Oral (11/15 1959) BP: 100/49 (11/15 2107) Pulse Rate: 71 (11/15 2107)  Labs:  Recent Labs  03/19/16 0748 03/19/16 1117 03/19/16 2133 03/20/16 0346 03/20/16 1156 03/20/16 2056  HGB 13.4  --   --  11.6*  --   --   HCT 39.8  --   --  33.9*  --   --   PLT 173  --   --  147*  --   --   APTT 27  --   --   --   --   --   LABPROT 11.8  --   --   --   --   --   INR 0.87  --   --   --   --   --   HEPARINUNFRC  --   --   --  0.42 0.68 0.56  CREATININE 1.09*  --   --  0.94  --   --   TROPONINI 0.24* 2.99* 18.10* 13.07*  --   --     Estimated Creatinine Clearance: 42.5 mL/min (by C-G formula based on SCr of 0.94 mg/dL).   Medical History: Past Medical History:  Diagnosis Date  . Allergy   . Depression   . Essential hypertension   . Fibrocystic breast disease   . HLD (hyperlipidemia)   . Hypothyroidism   . Ischemic bowel disease (HCC) 2011  . Osteoporosis   . Systolic dysfunction    a. PCP with reported echo from 2010 showing EF 40-45%, mod MR; b. pt reported nuc following 2010 echo was normal    Medications:  Scheduled:  . aspirin  81 mg Oral Daily  . atorvastatin  40 mg Oral q1800  . calcium citrate-vitamin D  1 tablet Oral BID  . [START ON 03/21/2016] clopidogrel  75 mg Oral Daily  . levothyroxine  50 mcg Oral QAC breakfast  . losartan  25 mg Oral Daily  . metoprolol tartrate  25 mg Oral BID  . sodium chloride  500 mL Intravenous Once  . sodium chloride flush  3 mL Intravenous Q12H  . sodium chloride flush  3 mL Intravenous Q12H     Assessment: Pharmacy consulted to dose heparin in this 82 year old female.  MD wants drip started 4 hrs after TR band removal.  Band was removed on 11/14 @ 1314. Pt was already on heparin 5000 units SQ Q8H.  CrCl = 36.6 ml/min.  Goal of Therapy:  Heparin level 0.3-0.7 units/ml Monitor platelets by anticoagulation protocol: Yes   Plan:  Heparin level therapeutic at 0.68, however jumped from 0.48. Will decrease rate slightly to avoid going supra therapeutic. Recheck level in 8 hours. Continue heparin drip for a total of 48 hours per cardiology.  11/15:  HL @ 21:00 = 0.56 Will continue this pt at current rate of 700 units/hr and recheck HL on 11/16 with AM labs.    Dereke Neumann D 03/20/2016,10:05 PM

## 2016-03-21 ENCOUNTER — Telehealth: Payer: Self-pay

## 2016-03-21 DIAGNOSIS — I214 Non-ST elevation (NSTEMI) myocardial infarction: Principal | ICD-10-CM

## 2016-03-21 LAB — CBC
HCT: 34.1 % — ABNORMAL LOW (ref 35.0–47.0)
Hemoglobin: 11.7 g/dL — ABNORMAL LOW (ref 12.0–16.0)
MCH: 31.4 pg (ref 26.0–34.0)
MCHC: 34.2 g/dL (ref 32.0–36.0)
MCV: 91.8 fL (ref 80.0–100.0)
PLATELETS: 144 10*3/uL — AB (ref 150–440)
RBC: 3.71 MIL/uL — AB (ref 3.80–5.20)
RDW: 15.1 % — ABNORMAL HIGH (ref 11.5–14.5)
WBC: 5.4 10*3/uL (ref 3.6–11.0)

## 2016-03-21 LAB — HEPARIN LEVEL (UNFRACTIONATED): Heparin Unfractionated: 0.51 IU/mL (ref 0.30–0.70)

## 2016-03-21 MED ORDER — METOPROLOL TARTRATE 25 MG PO TABS: 25.0000 mg | ORAL_TABLET | Freq: Two times a day (BID) | ORAL | 0 refills | Status: DC

## 2016-03-21 MED ORDER — CLOPIDOGREL BISULFATE 75 MG PO TABS: 75.0000 mg | ORAL_TABLET | Freq: Every day | ORAL | 0 refills | Status: DC

## 2016-03-21 MED ORDER — NITROGLYCERIN 0.4 MG SL SUBL: 0.4000 mg | SUBLINGUAL_TABLET | SUBLINGUAL | 0 refills | Status: AC | PRN

## 2016-03-21 MED ORDER — ATORVASTATIN CALCIUM 40 MG PO TABS: 40.0000 mg | ORAL_TABLET | Freq: Every day | ORAL | 0 refills | Status: DC

## 2016-03-21 NOTE — Discharge Summary (Signed)
[employer] - [place] at [facility]   PATIENT NAME: Alyssa EpsteinSally Christensen    MR#:  [id number]  DATE OF BIRTH:  08-24-1930  DATE OF ADMISSION:  03/19/2016 ADMITTING PHYSICIAN: Alford Highlandichard Panagiotis Oelkers, MD  DATE OF DISCHARGE: 03/21/2016 12:57 PM  PRIMARY CARE PHYSICIAN: Barbette ReichmannHANDE,VISHWANATH, MD    ADMISSION DIAGNOSIS:  Unstable angina (HCC) [I20.0] Troponin I above reference range [R74.8]  DISCHARGE DIAGNOSIS:  Principal Problem:   NSTEMI (non-ST elevated myocardial infarction) [facility](HCC) Active Problems:   Chest pain   Essential hypertension   Hypothyroidism   Osteoporosis   Fibrocystic breast disease   Hypokalemia   HLD (hyperlipidemia)   Systolic dysfunction   SECONDARY DIAGNOSIS:   Past Medical History:  Diagnosis Date  . Allergy   . Depression   . Essential hypertension   . Fibrocystic breast disease   . HLD (hyperlipidemia)   . Hypothyroidism   . Ischemic bowel disease (HCC) 2011  . Osteoporosis   . Systolic dysfunction    a. PCP with reported echo from 2010 showing EF 40-45%, mod MR; b. pt reported nuc following 2010 echo was normal    HOSPITAL COURSE:   1. NSTEMI. Patient was admitted with chest pain and borderline troponin. Troponin went up to 18.1. Patient was taken to the cardiac Cath Lab and no blockages were seen. Medical management with heparin for 48 hours. Aspirin, metoprolol, Plavix, Lipitor prescribed. This likely is a stress induced cardiomyopathy. Patient will be set up for cardiac rehabilitation. Patient was chest pain-free upon discharge home. 2. Essential hypertension continue her usual medications 3. Hyperlipidemia unspecified on atorvastatin 4. Hypothyroidism unspecified on levothyroxine   DISCHARGE CONDITIONS:   Satisfactory  CONSULTS OBTAINED:  Treatment Team:  Yvonne Kendallhristopher End, MD  DRUG ALLERGIES:   Allergies  Allergen Reactions  . Ampicillin   . Codeine   . Mevacor [Lovastatin]   . Valium [Diazepam]     DISCHARGE MEDICATIONS:    Discharge Medication List as of 03/21/2016 11:21 AM    START taking these medications   Details  atorvastatin (LIPITOR) 40 MG tablet Take 1 tablet (40 mg total) by mouth daily at 6 PM., Starting Thu 03/21/2016, Normal    clopidogrel (PLAVIX) 75 MG tablet Take 1 tablet (75 mg total) by mouth daily., Starting Thu 03/21/2016, Normal    nitroGLYCERIN (NITROSTAT) 0.4 MG SL tablet Place 1 tablet (0.4 mg total) under the tongue every 5 (five) minutes as needed for chest pain., Starting Thu 03/21/2016, Normal      CONTINUE these medications which have CHANGED   Details  metoprolol tartrate (LOPRESSOR) 25 MG tablet Take 1 tablet (25 mg total) by mouth 2 (two) times daily., Starting Thu 03/21/2016, Normal      CONTINUE these medications which have NOT CHANGED   Details  aspirin 81 MG chewable tablet Chew 81 mg by mouth daily., Historical Med    calcium-vitamin D (SM CALCIUM 500/VITAMIN D3) 500-400 MG-UNIT tablet Take by mouth., Historical Med    levothyroxine (SYNTHROID, LEVOTHROID) 50 MCG tablet TAKE ONE TABLET BY MOUTH ONCE DAILY. TAKE ON AN EMPTY STOMACH WITH A GLASS OF WATER AT LEAST 30-60 MINUTES BEFORE BREAKFAST, Historical Med    losartan (COZAAR) 25 MG tablet TAKE ONE TABLET BY MOUTH ONCE DAILY, Historical Med    Multiple Vitamin (MULTI-VITAMINS) TABS Take by mouth., Historical Med      STOP taking these medications     simvastatin (ZOCOR) 20 MG tablet          DISCHARGE INSTRUCTIONS:   Follow-up  PMD one week Follow-up with Dr. Okey DupreEnd cardiology 1-2 weeks  If you experience worsening of your admission symptoms, develop shortness of breath, life threatening emergency, suicidal or homicidal thoughts you must seek medical attention immediately by calling 911 or calling your MD immediately  if symptoms less severe.  You Must read complete instructions/literature along with all the possible adverse reactions/side effects for all the Medicines you take and that have been  prescribed to you. Take any new Medicines after you have completely understood and accept all the possible adverse reactions/side effects.   Please note  You were cared for by a hospitalist during your hospital stay. If you have any questions about your discharge medications or the care you received while you were in the hospital after you are discharged, you can call the unit and asked to speak with the hospitalist on call if the hospitalist that took care of you is not available. Once you are discharged, your primary care physician will handle any further medical issues. Please note that NO REFILLS for any discharge medications will be authorized once you are discharged, as it is imperative that you return to your primary care physician (or establish a relationship with a primary care physician if you do not have one) for your aftercare needs so that they can reassess your need for medications and monitor your lab values.    Today   CHIEF COMPLAINT:   Chief Complaint  Patient presents with  . Chest Pain    HISTORY OF PRESENT ILLNESS:  Alyssa Christensen  is a 82 y.o. female presents with chest pain   VITAL SIGNS:  Blood pressure (!) 127/50, pulse 64, temperature 97.8 F (36.6 C), temperature source Oral, resp. rate 18, height 5\' 6"  (1.676 m), weight 66.5 kg (146 lb 11.2 oz), SpO2 98 %.   PHYSICAL EXAMINATION:  GENERAL:  82 y.o.-year-old patient lying in the bed with no acute distress.  EYES: Pupils equal, round, reactive to light and accommodation. No scleral icterus. Extraocular muscles intact.  HEENT: Head atraumatic, normocephalic. Oropharynx and nasopharynx clear.  NECK:  Supple, no jugular venous distention. No thyroid enlargement, no tenderness.  LUNGS: Normal breath sounds bilaterally, no wheezing, rales,rhonchi or crepitation. No use of accessory muscles of respiration.  CARDIOVASCULAR: S1, S2 normal. No murmurs, rubs, or gallops.  ABDOMEN: Soft, non-tender, non-distended. Bowel  sounds present. No organomegaly or mass.  EXTREMITIES: No pedal edema, cyanosis, or clubbing.  NEUROLOGIC: Cranial nerves II through XII are intact. Muscle strength 5/5 in all extremities. Sensation intact. Gait not checked.  PSYCHIATRIC: The patient is alert and oriented x 3.  SKIN: No obvious rash, lesion, or ulcer.   DATA REVIEW:   CBC  Recent Labs Lab 03/21/16 0348  WBC 5.4  HGB 11.7*  HCT 34.1*  PLT 144*    Chemistries   Recent Labs Lab 03/19/16 1117 03/20/16 0346  NA  --  140  K  --  3.9  CL  --  108  CO2  --  26  GLUCOSE  --  92  BUN  --  19  CREATININE  --  0.94  CALCIUM  --  8.8*  MG 2.0  --     Cardiac Enzymes  Recent Labs Lab 03/20/16 0346  TROPONINI 13.07*    Management plans discussed with the patient, and she is in agreement.  CODE STATUS:     Code Status Orders        Start     Ordered   03/19/16  1000  Full code  Continuous     03/19/16 0959    Code Status History    Date Active Date Inactive Code Status Order ID Comments User Context   This patient has a current code status but no historical code status.      TOTAL TIME TAKING CARE OF THIS PATIENT: 35 minutes.    Alford Highland M.D on 03/21/2016 at 3:21 PM  Between 7am to 6pm - Pager - [phone]  After 6pm go to [email or web address] - password [employer]  [employer] Office  [phone]  CC: Primary care physician; Barbette Reichmann, MD

## 2016-03-21 NOTE — Progress Notes (Signed)
ANTICOAGULATION CONSULT NOTE - Initial Consult  Pharmacy Consult for Heparin  Indication: chest pain/ACS  Allergies  Allergen Reactions  . Ampicillin   . Codeine   . Mevacor [Lovastatin]   . Valium [Diazepam]     Patient Measurements: Height: 5\' 6"  (167.6 cm) Weight: 146 lb 11.2 oz (66.5 kg) IBW/kg (Calculated) : 59.3 Heparin Dosing Weight: 65.8 kg  Vital Signs: Temp: 98 F (36.7 C) (11/16 0445) Temp Source: Oral (11/16 0445) BP: 134/53 (11/16 0445) Pulse Rate: 60 (11/16 0445)  Labs:  Recent Labs  03/19/16 0748 03/19/16 1117 03/19/16 2133  03/20/16 0346 03/20/16 1156 03/20/16 2056 03/21/16 0348  HGB 13.4  --   --   --  11.6*  --   --  11.7*  HCT 39.8  --   --   --  33.9*  --   --  34.1*  PLT 173  --   --   --  147*  --   --  144*  APTT 27  --   --   --   --   --   --   --   LABPROT 11.8  --   --   --   --   --   --   --   INR 0.87  --   --   --   --   --   --   --   HEPARINUNFRC  --   --   --   < > 0.42 0.68 0.56 0.51  CREATININE 1.09*  --   --   --  0.94  --   --   --   TROPONINI 0.24* 2.99* 18.10*  --  13.07*  --   --   --   < > = values in this interval not displayed.  Estimated Creatinine Clearance: 42.5 mL/min (by C-G formula based on SCr of 0.94 mg/dL).   Medical History: Past Medical History:  Diagnosis Date  . Allergy   . Depression   . Essential hypertension   . Fibrocystic breast disease   . HLD (hyperlipidemia)   . Hypothyroidism   . Ischemic bowel disease (HCC) 2011  . Osteoporosis   . Systolic dysfunction    a. PCP with reported echo from 2010 showing EF 40-45%, mod MR; b. pt reported nuc following 2010 echo was normal    Medications:  Scheduled:  . aspirin  81 mg Oral Daily  . atorvastatin  40 mg Oral q1800  . calcium citrate-vitamin D  1 tablet Oral BID  . clopidogrel  75 mg Oral Daily  . levothyroxine  50 mcg Oral QAC breakfast  . losartan  25 mg Oral Daily  . metoprolol tartrate  25 mg Oral BID  . sodium chloride flush  3 mL  Intravenous Q12H  . sodium chloride flush  3 mL Intravenous Q12H    Assessment: Pharmacy consulted to dose heparin in this 82 year old female.  MD wants drip started 4 hrs after TR band removal.  Band was removed on 11/14 @ 1314. Pt was already on heparin 5000 units SQ Q8H.  CrCl = 36.6 ml/min.  Goal of Therapy:  Heparin level 0.3-0.7 units/ml Monitor platelets by anticoagulation protocol: Yes   Plan:  Heparin level therapeutic at 0.68, however jumped from 0.48. Will decrease rate slightly to avoid going supra therapeutic. Recheck level in 8 hours. Continue heparin drip for a total of 48 hours per cardiology.  11/15:  HL @ 21:00 = 0.56 Will continue this  pt at current rate of 700 units/hr and recheck HL on 11/16 with AM labs.   11/16 AM heparin level 0.51. Continue current regimen. Recheck HL/CBC with tomorrow AM labs.   Taesean Reth S 03/21/2016,5:41 AM

## 2016-03-21 NOTE — Telephone Encounter (Signed)
[facility].  Patient understands to follow up with Dr. Okey DupreEnd on 04/02/16 at 2:00 [facility]. Patient understands discharge instructions? yes Patient understands medications and regiment? yes Patient understands to bring all medications to this visit? yes

## 2016-03-21 NOTE — Progress Notes (Signed)
Pt discharged to home via wc.  Instructions and rx given to pt.  Questions answered.  No distress.  

## 2016-03-21 NOTE — Telephone Encounter (Signed)
-----   Message from Coralee RudSabrina F Gilley sent at 03/21/2016 10:02 AM EST ----- Regarding: tcm/ph 11/28/ Dr. Okey DupreEnd 2:00

## 2016-03-21 NOTE — Discharge Instructions (Signed)
Medication escribed to pharmacy. 

## 2016-03-21 NOTE — Progress Notes (Signed)
Patient: Alyssa Christensen / Admit Date: 03/19/2016 / Date of Encounter: 03/21/2016, 7:49 AM   Subjective: Brief episode of chest pain overnight that lasted a couple of seconds. Was given SL NTG x 1. No further chest pain. Ambulated without issues. BP in the low 100's systolic overnight, into the 140's systolic this morning. Labs stable. No complaints at this time.   Review of Systems: Review of Systems  Constitutional: Positive for malaise/fatigue. Negative for chills, diaphoresis, fever and weight loss.  HENT: Negative for congestion.   Eyes: Negative for discharge and redness.  Respiratory: Negative for cough, hemoptysis, sputum production, shortness of breath and wheezing.   Cardiovascular: Positive for chest pain. Negative for palpitations, orthopnea, claudication, leg swelling and PND.  Gastrointestinal: Negative for abdominal pain, blood in stool, heartburn, melena, nausea and vomiting.  Genitourinary: Negative for hematuria.  Musculoskeletal: Negative for falls and myalgias.  Skin: Negative for rash.  Neurological: Negative for dizziness, tingling, tremors, sensory change, speech change, focal weakness, loss of consciousness and weakness.  Endo/Heme/Allergies: Does not bruise/bleed easily.  Psychiatric/Behavioral: Negative for substance abuse. The patient is not nervous/anxious.   All other systems reviewed and are negative.   Objective: Telemetry: NSR, 70's bpm Physical Exam: Blood pressure (!) 145/56, pulse 65, temperature 97.9 F (36.6 C), temperature source Oral, resp. rate 16, height 5\' 6"  (1.676 m), weight 146 lb 11.2 oz (66.5 kg), SpO2 97 %. Body mass index is 23.68 kg/m. General: Well developed, well nourished, in no acute distress. Head: Normocephalic, atraumatic, sclera non-icteric, no xanthomas, nares are without discharge. Neck: Negative for carotid bruits. JVP not elevated. Lungs: Clear bilaterally to auscultation without wheezes, rales, or rhonchi. Breathing is  unlabored. Heart: RRR S1 S2 without murmurs, rubs, or gallops. Right radial cath site without bleeding, bruising, swelling, erythema, or TTP. Some rash noted along bandages, changed to paper tape with improvement.  Abdomen: Soft, non-tender, non-distended with normoactive bowel sounds. No rebound/guarding. Extremities: No clubbing or cyanosis. No edema. Distal pedal pulses are 2+ and equal bilaterally. Neuro: Alert and oriented X 3. Moves all extremities spontaneously. Psych:  Responds to questions appropriately with a normal affect.   Intake/Output Summary (Last 24 hours) at 03/21/16 0749 Last data filed at 03/21/16 0510  Gross per 24 hour  Intake          2465.44 ml  Output             1875 ml  Net           590.44 ml    Inpatient Medications:  . aspirin  81 mg Oral Daily  . atorvastatin  40 mg Oral q1800  . calcium citrate-vitamin D  1 tablet Oral BID  . clopidogrel  75 mg Oral Daily  . levothyroxine  50 mcg Oral QAC breakfast  . losartan  25 mg Oral Daily  . metoprolol tartrate  25 mg Oral BID  . sodium chloride flush  3 mL Intravenous Q12H  . sodium chloride flush  3 mL Intravenous Q12H   Infusions:   Labs:  Recent Labs  03/19/16 0748 03/19/16 1117 03/20/16 0346  NA 141  --  140  K 3.5  --  3.9  CL 104  --  108  CO2 27  --  26  GLUCOSE 132*  --  92  BUN 25*  --  19  CREATININE 1.09*  --  0.94  CALCIUM 9.9  --  8.8*  MG  --  2.0  --  No results for input(s): AST, ALT, ALKPHOS, BILITOT, PROT, ALBUMIN in the last 72 hours.  Recent Labs  03/20/16 0346 03/21/16 0348  WBC 5.2 5.4  HGB 11.6* 11.7*  HCT 33.9* 34.1*  MCV 93.0 91.8  PLT 147* 144*    Recent Labs  03/19/16 0748 03/19/16 1117 03/19/16 2133 03/20/16 0346  TROPONINI 0.24* 2.99* 18.10* 13.07*   Invalid input(s): POCBNP  Recent Labs  03/19/16 1117  HGBA1C 5.9*     Weights: Filed Weights   03/19/16 0739 03/19/16 1112  Weight: 145 lb (65.8 kg) 146 lb 11.2 oz (66.5 kg)      Radiology/Studies:  Dg Chest 2 View  Result Date: 03/19/2016 CLINICAL DATA:  Patient reports left side chest pain onset this morning. Pain has since resolved. Hx CHF, COPD, HTN, breast biopsy. Non-smoker. EXAM: CHEST  2 VIEW COMPARISON:  10/01/2011 FINDINGS: The cardiac silhouette is normal in size and configuration. Normal mediastinal and hilar contours. Lungs are mildly hyperexpanded but clear. No pleural effusion or pneumothorax. Skeletal structures are demineralized but intact. IMPRESSION: No acute cardiopulmonary disease. Electronically Signed   By: Amie Portlandavid  Ormond M.D.   On: 03/19/2016 08:18     Assessment and Plan  Principal Problem:   NSTEMI (non-ST elevated myocardial infarction) [facility](HCC) Active Problems:   Chest pain   Essential hypertension   Hypothyroidism   Hypokalemia   HLD (hyperlipidemia)   Systolic dysfunction   Osteoporosis   Fibrocystic breast disease    1. NSTEMI: -Status post cardiac cath on 11/14 that did not show culprit lesion -Cannot rule out possibility of coronary vasospasm vs completely occluded small branch that may have been too small for PCI vs transient occlusion that was resolved with medical management prior to cardiac cath -Has completed heparin gtt mid morning this morning, discontinue mid morning -Was loaded with Plavix 300 mg on 11/15  -Start Plavix 75 mg daily on 03/21/16 -Continue ASA 81 mg daily, Lopressor, losartan, and Lipitor  -No further chest pain. Should she develop recurrent chest pain could add Imdur -Echo showed EF 50-55%, GR1DD, akinesis of the basalinferior myocardium, moderate PR -Recheck 12-lead EKG pending -Plan for outpatient cardiac rehab  2. Systolic dysfunction: -She does not appear to be volume overloaded -Echo as above -Continue beta blocker and losartan  -No indication for standing Lasix at this time  3. Perioral tightness/paraesthesia: -Resolved -She is not certain she felt this or if it was in her head as  she read about this previously -Has had similar sensation previously  -Monitor -Neurologically intact -No indication for imaging at this time  4. Accelerated HTN: -Improved -Continue current medications  5. Hypothyroidism: -On replacement therapy  6. Hypokalemia: -Repleted   7. HLD: -Lipitor 40 mg daily  8. Fibrocystic breast disease: -Follow up with PCP  Signed, Eula Listenyan Dunn, PA-C [facility] Pager: [phone] 03/21/2016, 7:49 AM

## 2016-04-02 ENCOUNTER — Ambulatory Visit (INDEPENDENT_AMBULATORY_CARE_PROVIDER_SITE_OTHER): Payer: Medicare Other | Admitting: Internal Medicine

## 2016-04-02 ENCOUNTER — Encounter: Payer: Self-pay | Admitting: Internal Medicine

## 2016-04-02 VITALS — BP 140/68 | HR 75 | Ht 66.0 in | Wt 151.2 lb

## 2016-04-02 DIAGNOSIS — I10 Essential (primary) hypertension: Secondary | ICD-10-CM | POA: Diagnosis not present

## 2016-04-02 DIAGNOSIS — I200 Unstable angina: Secondary | ICD-10-CM

## 2016-04-02 DIAGNOSIS — I214 Non-ST elevation (NSTEMI) myocardial infarction: Secondary | ICD-10-CM | POA: Diagnosis not present

## 2016-04-02 MED ORDER — ATORVASTATIN CALCIUM 40 MG PO TABS: 40.0000 mg | ORAL_TABLET | Freq: Every day | ORAL | 3 refills | Status: AC

## 2016-04-02 MED ORDER — METOPROLOL TARTRATE 50 MG PO TABS: 50.0000 mg | ORAL_TABLET | Freq: Two times a day (BID) | ORAL | 3 refills | Status: AC

## 2016-04-02 MED ORDER — CLOPIDOGREL BISULFATE 75 MG PO TABS: 75.0000 mg | ORAL_TABLET | Freq: Every day | ORAL | 3 refills | Status: AC

## 2016-04-02 NOTE — Progress Notes (Signed)
Follow-up Outpatient Visit Date: 04/02/2016  Chief Complaint: Hospital follow-up with recent NSTEMI  HPI:  [name] is a 82 y.o. year-old female with history of coronary artery disease with recent NSTEMI (03/2016), hypertension, hyperlipidemia, systolic dysfunction (echo in [id number] reportedly showed LVEF 40-45%; recent echo with LVEF 50%) who presents for follow-up after recent NSTEMI. The patient presented to [facility] ED on 03/19/16 with chest pain that woke her up around 3 AM.  Initial troponin was minimally elevated at 0.24 but ultimately climbed to 18.1.  She underwent urgent LHC, which showed non-obstructive coronary artery disease with basal inferior akinesis and minimal contrast trapping at the apex with an LVEF of 50%. No culprit lesion was identified and the patient was treated medically with DAPT and heparin infusion x 48 hours. Since leaving the hospital, the patient has not had any further episodes of chest pain reminiscent of what brought her to the emergency department. She has occasional dull pain along the inferior costal margin on the left lasting a few seconds at a time. This is not exertional and has been present for years. She has been walking and riding here comment bike on a daily basis without any chest pains. She has mild exertional dyspnea that has been stable for years. She denies orthopnea, PND, palpitations, and lightheadedness. She has chronic stable lower extremity edema that is well controlled with compression stockings.  The patient reports being compliant with her medications, including dual antiplatelet therapy with aspirin and clopidogrel. She has not had any bleeding or falls.  --------------------------------------------------------------------------------------------------  Cardiovascular History & Procedures: Cardiovascular Problems:  NSTEMI  Risk Factors:  Known coronary artery disease, hypertension, hyperlipidemia, and age > 80  Cath/PCI:  LHC (03/19/16):  Mild to moderate nonobstructive CAD with 30% LMCA stenosis, LMCA ostial/proximal LAD, 30% ostial D1, and 30% mid/distal RCA disease. Mildly elevated left ventricular filling pressure. Basal inferior akinesis with otherwise preserved LV contraction (EF 50-55%).  CV Surgery:  None  EP Procedures and Devices:  None  Non-Invasive Evaluation(s):  TTE (03/19/16): Normal LV size and wall thickness with akinesis of the basal inferior myocardium and otherwise preserved contraction. LVEF 50-55% with grade 1 diastolic dysfunction. Normal RV size and contraction. Mild to moderate pulmonic regurgitation. Otherwise, no significant valvular abnormalities.  Recent CV Pertinent Labs: Lab Results  Component Value Date   CHOL 168 03/20/2016   CHOL 169 10/02/2011   HDL 87 03/20/2016   HDL 82 (H) 10/02/2011   LDLCALC 72 03/20/2016   LDLCALC 76 10/02/2011   TRIG 46 03/20/2016   TRIG 54 10/02/2011   CHOLHDL 1.9 03/20/2016   INR 0.87 03/19/2016   BNP 291 10/01/2011   K 3.9 03/20/2016   K 3.8 10/01/2011   MG 2.0 03/19/2016   MG 2.2 10/02/2011   BUN 19 03/20/2016   BUN 22 (H) 10/01/2011   CREATININE 0.94 03/20/2016   CREATININE 1.12 10/01/2011    Past medical and surgical history were reviewed and updated in EPIC.  Outpatient Encounter Prescriptions as of 04/02/2016  Medication Sig  . aspirin 81 MG chewable tablet Chew 81 mg by mouth daily.  Marland Kitchen. atorvastatin (LIPITOR) 40 MG tablet Take 1 tablet (40 mg total) by mouth daily at 6 PM.  . calcium-vitamin D (SM CALCIUM 500/VITAMIN D3) 500-400 MG-UNIT tablet Take by mouth.  . clopidogrel (PLAVIX) 75 MG tablet Take 1 tablet (75 mg total) by mouth daily.  Marland Kitchen. levothyroxine (SYNTHROID, LEVOTHROID) 50 MCG tablet TAKE ONE TABLET BY MOUTH ONCE DAILY. TAKE ON AN EMPTY  STOMACH WITH A GLASS OF WATER AT LEAST 30-60 MINUTES BEFORE BREAKFAST  . losartan (COZAAR) 25 MG tablet TAKE ONE TABLET BY MOUTH ONCE DAILY  . metoprolol tartrate (LOPRESSOR) 25 MG tablet Take 1  tablet (25 mg total) by mouth 2 (two) times daily.  . Multiple Vitamin (MULTI-VITAMINS) TABS Take by mouth.  . nitroGLYCERIN (NITROSTAT) 0.4 MG SL tablet Place 1 tablet (0.4 mg total) under the tongue every 5 (five) minutes as needed for chest pain.   No facility-administered encounter medications on file as of 04/02/2016.     Allergies: Ampicillin; Codeine; Mevacor [lovastatin]; and Valium [diazepam]  Social History   Social History  . Marital status: Married    Spouse name: N/A  . Number of children: N/A  . Years of education: N/A   Occupational History  . Not on file.   Social History Main Topics  . Smoking status: Never Smoker  . Smokeless tobacco: Never Used  . Alcohol use No  . Drug use: No  . Sexual activity: No   Other Topics Concern  . Not on file   Social History Narrative  . No narrative on file    Family History  Problem Relation Age of Onset  . Breast cancer Paternal Aunt [id number]  . Cancer Mother   . Cancer Father     Review of Systems: A 12-system review of systems was performed and was negative except as noted in the HPI.  --------------------------------------------------------------------------------------------------  Physical Exam: BP 140/68 (BP Location: Left Arm, Patient Position: Sitting, Cuff Size: Normal)   Pulse 75   Ht 5\' 6"  (1.676 m)   Wt 151 lb 4 oz (68.6 kg)   BMI 24.41 kg/m   General:  Well-developed, well-nourished elderly woman seated comfortably in the exam room. HEENT: No conjunctival pallor or scleral icterus.  Moist mucous membranes.  OP clear. Neck: Supple without lymphadenopathy, thyromegaly, JVD, or HJR. Lungs: Normal work of breathing.  Clear to auscultation bilaterally without wheezes or crackles. Heart: Regular rate and rhythm without murmurs, rubs, or gallops.  Non-displaced PMI. Abd: Bowel sounds present.  Soft, NT/ND without hepatosplenomegaly Ext: Trace pretibial edema bilaterally with compression stockings in place.   2+ radial pulses bilaterally. Right radial arteriotomy site is well-healed. Skin: warm and dry without rash  EKG:  Normal sinus rhythm with inferolateral T-wave inversions. Compared with prior tracing from 03/19/16, inferolateral T-wave versions are new. Previously noted nonspecific ST segment changes are no longer evident. I have personally reviewed both tracings.  Lab Results  Component Value Date   WBC 5.4 03/21/2016   HGB 11.7 (L) 03/21/2016   HCT 34.1 (L) 03/21/2016   MCV 91.8 03/21/2016   PLT 144 (L) 03/21/2016    Lab Results  Component Value Date   NA 140 03/20/2016   K 3.9 03/20/2016   CL 108 03/20/2016   CO2 26 03/20/2016   BUN 19 03/20/2016   CREATININE 0.94 03/20/2016   GLUCOSE 92 03/20/2016   ALT 21 10/01/2011    Lab Results  Component Value Date   CHOL 168 03/20/2016   HDL 87 03/20/2016   LDLCALC 72 03/20/2016   TRIG 46 03/20/2016   CHOLHDL 1.9 03/20/2016    --------------------------------------------------------------------------------------------------  ASSESSMENT AND PLAN: Nonobstructive coronary artery disease and recent NSTEMI Patient has done well since hospital discharge. Her intermittent lower left chest pain is atypical and has been present for years. It is notably different from the discomfort that the patient experienced at the time of her MI earlier this  month. EKG today demonstrates inferolateral T-wave inversions, likely representing expected progression of her small basal inferior infarct. Alternatively, her event could have represented an atypical Takotsubo pattern, though I favor infarction of a small branch vessel. We will plan to continue dual antiplatelet therapy for up to a year if tolerated followed by indefinite low-dose aspirin. We will increase metoprolol to 50 mg twice a day to optimize blood pressure and heart rate control. We will continue with high intensity statin and plan to repeat a lipid panel when she returns for  follow-up.  Hypertension Blood pressure is borderline elevated today. As above, we will increase metoprolol titrate 50 mg twice a day. The patient should continue her current dose of losartan. The patient was encouraged to stay active. We will also ensure that she is enrolled in cardiac rehabilitation.  Follow-up: Return to clinic in 3 months  Yvonne Kendall, MD 04/02/2016 3:11 PM

## 2016-04-02 NOTE — Patient Instructions (Signed)
Medication Instructions:  Your physician has recommended you make the following change in your medication:  1- INCREASE  Metoprolol to 50 mg by mouth twice a day.   Follow-Up: Your physician recommends that you schedule a follow-up appointment in: 3 MONTHS WITH DR END.   If you need a refill on your cardiac medications before your next appointment, please call your pharmacy.

## 2016-04-03 ENCOUNTER — Telehealth: Payer: Self-pay | Admitting: *Deleted

## 2016-04-03 DIAGNOSIS — I214 Non-ST elevation (NSTEMI) myocardial infarction: Secondary | ICD-10-CM

## 2016-04-03 NOTE — Telephone Encounter (Signed)
-----   Message from Yvonne Kendallhristopher End, MD sent at 04/02/2016  5:47 PM EST ----- Alyssa PewHi Alyssa Christensen,  Can you confirm that Ms. Crafts has a referral to cardiac rehab status post NSTEMI (medically managed without PCI)? Thanks.  Thayer Ohmhris

## 2016-04-03 NOTE — Telephone Encounter (Signed)
S/w patient. She does not have cardiac rehab set up at this time. Patient willing to do rehab but she says she has several upcoming appts and traveling out of town with the holidays this month. Patient stated she exercises regularly and has been doing this for years. She verbalized understanding that Cardiac rehab is recommended re: recent NSTEMI. Order entered for rehab referral and patient aware someone will be calling her to arrange.  Will route to Dr End as Lorain ChildesFYI.

## 2016-04-09 ENCOUNTER — Telehealth: Payer: Self-pay | Admitting: *Deleted

## 2016-04-09 NOTE — Telephone Encounter (Signed)
S/w Karla at rehab to follow up on cardiac rehab program for patient. She called the patient yesterday and left a message for the patient to call her back to schedule.

## 2016-04-11 ENCOUNTER — Telehealth: Payer: Self-pay | Admitting: Internal Medicine

## 2016-04-11 NOTE — Telephone Encounter (Signed)
Patient calling in wanting to see if she could fly to visit family. Let her know that I checked with our physician assistant and he said as long as she is feeling well there is no contraindication for her to travel. She was appreciative for the call and had no further questions.

## 2016-04-11 NOTE — Telephone Encounter (Signed)
Patient wanting to know if its ok if she travels by air for the Holidays?

## 2016-04-12 ENCOUNTER — Encounter (HOSPITAL_COMMUNITY): Payer: Self-pay | Admitting: *Deleted

## 2016-04-12 ENCOUNTER — Emergency Department (HOSPITAL_COMMUNITY): Payer: Medicare Other

## 2016-04-12 ENCOUNTER — Inpatient Hospital Stay (HOSPITAL_COMMUNITY)
Admission: EM | Admit: 2016-04-12 | Discharge: 2016-05-06 | DRG: 023 | Disposition: E | Payer: Medicare Other | Attending: Emergency Medicine | Admitting: Emergency Medicine

## 2016-04-12 DIAGNOSIS — Z9911 Dependence on respirator [ventilator] status: Secondary | ICD-10-CM | POA: Diagnosis not present

## 2016-04-12 DIAGNOSIS — Z9889 Other specified postprocedural states: Secondary | ICD-10-CM | POA: Diagnosis not present

## 2016-04-12 DIAGNOSIS — I5022 Chronic systolic (congestive) heart failure: Secondary | ICD-10-CM | POA: Diagnosis present

## 2016-04-12 DIAGNOSIS — Z888 Allergy status to other drugs, medicaments and biological substances status: Secondary | ICD-10-CM

## 2016-04-12 DIAGNOSIS — E876 Hypokalemia: Secondary | ICD-10-CM | POA: Diagnosis present

## 2016-04-12 DIAGNOSIS — R739 Hyperglycemia, unspecified: Secondary | ICD-10-CM | POA: Diagnosis present

## 2016-04-12 DIAGNOSIS — M810 Age-related osteoporosis without current pathological fracture: Secondary | ICD-10-CM | POA: Diagnosis present

## 2016-04-12 DIAGNOSIS — Z803 Family history of malignant neoplasm of breast: Secondary | ICD-10-CM

## 2016-04-12 DIAGNOSIS — I959 Hypotension, unspecified: Secondary | ICD-10-CM | POA: Diagnosis present

## 2016-04-12 DIAGNOSIS — R4189 Other symptoms and signs involving cognitive functions and awareness: Secondary | ICD-10-CM | POA: Diagnosis not present

## 2016-04-12 DIAGNOSIS — Z7982 Long term (current) use of aspirin: Secondary | ICD-10-CM | POA: Diagnosis not present

## 2016-04-12 DIAGNOSIS — I2510 Atherosclerotic heart disease of native coronary artery without angina pectoris: Secondary | ICD-10-CM | POA: Diagnosis present

## 2016-04-12 DIAGNOSIS — Z79899 Other long term (current) drug therapy: Secondary | ICD-10-CM

## 2016-04-12 DIAGNOSIS — R4020 Unspecified coma: Secondary | ICD-10-CM | POA: Diagnosis present

## 2016-04-12 DIAGNOSIS — E039 Hypothyroidism, unspecified: Secondary | ICD-10-CM | POA: Diagnosis present

## 2016-04-12 DIAGNOSIS — Z880 Allergy status to penicillin: Secondary | ICD-10-CM | POA: Diagnosis not present

## 2016-04-12 DIAGNOSIS — I252 Old myocardial infarction: Secondary | ICD-10-CM | POA: Diagnosis not present

## 2016-04-12 DIAGNOSIS — D6489 Other specified anemias: Secondary | ICD-10-CM | POA: Diagnosis present

## 2016-04-12 DIAGNOSIS — G9340 Encephalopathy, unspecified: Secondary | ICD-10-CM | POA: Diagnosis not present

## 2016-04-12 DIAGNOSIS — Z886 Allergy status to analgesic agent status: Secondary | ICD-10-CM | POA: Diagnosis not present

## 2016-04-12 DIAGNOSIS — E872 Acidosis: Secondary | ICD-10-CM | POA: Diagnosis present

## 2016-04-12 DIAGNOSIS — D696 Thrombocytopenia, unspecified: Secondary | ICD-10-CM | POA: Diagnosis present

## 2016-04-12 DIAGNOSIS — I609 Nontraumatic subarachnoid hemorrhage, unspecified: Secondary | ICD-10-CM | POA: Diagnosis present

## 2016-04-12 DIAGNOSIS — Z66 Do not resuscitate: Secondary | ICD-10-CM | POA: Diagnosis present

## 2016-04-12 DIAGNOSIS — J9601 Acute respiratory failure with hypoxia: Secondary | ICD-10-CM | POA: Diagnosis present

## 2016-04-12 DIAGNOSIS — I615 Nontraumatic intracerebral hemorrhage, intraventricular: Secondary | ICD-10-CM | POA: Diagnosis present

## 2016-04-12 DIAGNOSIS — N179 Acute kidney failure, unspecified: Secondary | ICD-10-CM | POA: Diagnosis present

## 2016-04-12 DIAGNOSIS — I110 Hypertensive heart disease with heart failure: Secondary | ICD-10-CM | POA: Diagnosis present

## 2016-04-12 DIAGNOSIS — G911 Obstructive hydrocephalus: Secondary | ICD-10-CM | POA: Diagnosis not present

## 2016-04-12 HISTORY — DX: Acute myocardial infarction, unspecified: I21.9

## 2016-04-12 LAB — I-STAT CHEM 8, ED
BUN: 25 mg/dL — ABNORMAL HIGH (ref 6–20)
CREATININE: 1.2 mg/dL — AB (ref 0.44–1.00)
Calcium, Ion: 1.05 mmol/L — ABNORMAL LOW (ref 1.15–1.40)
Chloride: 103 mmol/L (ref 101–111)
GLUCOSE: 195 mg/dL — AB (ref 65–99)
HEMATOCRIT: 35 % — AB (ref 36.0–46.0)
HEMOGLOBIN: 11.9 g/dL — AB (ref 12.0–15.0)
Potassium: 3.2 mmol/L — ABNORMAL LOW (ref 3.5–5.1)
Sodium: 138 mmol/L (ref 135–145)
TCO2: 23 mmol/L (ref 0–100)

## 2016-04-12 LAB — I-STAT TROPONIN, ED: TROPONIN I, POC: 0 ng/mL (ref 0.00–0.08)

## 2016-04-12 LAB — TROPONIN I
TROPONIN I: 0.19 ng/mL — AB (ref <0.03)
TROPONIN I: 0.17 ng/mL — AB (ref <0.03)

## 2016-04-12 LAB — CBC
HCT: 34.8 % — ABNORMAL LOW (ref 36.0–46.0)
HEMATOCRIT: 33.8 % — AB (ref 36.0–46.0)
HEMOGLOBIN: 11.3 g/dL — AB (ref 12.0–15.0)
Hemoglobin: 11.8 g/dL — ABNORMAL LOW (ref 12.0–15.0)
MCH: 31.1 pg (ref 26.0–34.0)
MCH: 30.5 pg (ref 26.0–34.0)
MCHC: 33.9 g/dL (ref 30.0–36.0)
MCHC: 33.4 g/dL (ref 30.0–36.0)
MCV: 91.8 fL (ref 78.0–100.0)
MCV: 91.1 fL (ref 78.0–100.0)
PLATELETS: 164 10*3/uL (ref 150–400)
Platelets: 157 10*3/uL (ref 150–400)
RBC: 3.79 MIL/uL — ABNORMAL LOW (ref 3.87–5.11)
RBC: 3.71 MIL/uL — AB (ref 3.87–5.11)
RDW: 15.4 % (ref 11.5–15.5)
RDW: 15.2 % (ref 11.5–15.5)
WBC: 5.6 10*3/uL (ref 4.0–10.5)
WBC: 11.4 10*3/uL — ABNORMAL HIGH (ref 4.0–10.5)

## 2016-04-12 LAB — GLUCOSE, CAPILLARY
GLUCOSE-CAPILLARY: 165 mg/dL — AB (ref 65–99)
GLUCOSE-CAPILLARY: 127 mg/dL — AB (ref 65–99)
Glucose-Capillary: 94 mg/dL (ref 65–99)

## 2016-04-12 LAB — I-STAT ARTERIAL BLOOD GAS, ED
Acid-Base Excess: 1 mmol/L (ref 0.0–2.0)
BICARBONATE: 25.9 mmol/L (ref 20.0–28.0)
O2 Saturation: 99 %
PCO2 ART: 40.7 mmHg (ref 32.0–48.0)
PH ART: 7.412 (ref 7.350–7.450)
PO2 ART: 158 mmHg — AB (ref 83.0–108.0)
TCO2: 27 mmol/L (ref 0–100)

## 2016-04-12 LAB — TRIGLYCERIDES: TRIGLYCERIDES: 52 mg/dL (ref <150)

## 2016-04-12 LAB — DIFFERENTIAL
BASOS ABS: 0 10*3/uL (ref 0.0–0.1)
BASOS PCT: 1 %
EOS ABS: 0.2 10*3/uL (ref 0.0–0.7)
EOS PCT: 3 %
LYMPHS ABS: 1.6 10*3/uL (ref 0.7–4.0)
Lymphocytes Relative: 28 %
Monocytes Absolute: 0.7 10*3/uL (ref 0.1–1.0)
Monocytes Relative: 12 %
NEUTROS PCT: 56 %
Neutro Abs: 3.2 10*3/uL (ref 1.7–7.7)

## 2016-04-12 LAB — COMPREHENSIVE METABOLIC PANEL
ALBUMIN: 3.7 g/dL (ref 3.5–5.0)
ALT: 21 U/L (ref 14–54)
ANION GAP: 12 (ref 5–15)
AST: 32 U/L (ref 15–41)
Alkaline Phosphatase: 55 U/L (ref 38–126)
BILIRUBIN TOTAL: 1.1 mg/dL (ref 0.3–1.2)
BUN: 24 mg/dL — ABNORMAL HIGH (ref 6–20)
CHLORIDE: 103 mmol/L (ref 101–111)
CO2: 23 mmol/L (ref 22–32)
Calcium: 9.1 mg/dL (ref 8.9–10.3)
Creatinine, Ser: 1.2 mg/dL — ABNORMAL HIGH (ref 0.44–1.00)
GFR calc Af Amer: 47 mL/min — ABNORMAL LOW (ref >60)
GFR calc non Af Amer: 41 mL/min — ABNORMAL LOW (ref >60)
GLUCOSE: 193 mg/dL — AB (ref 65–99)
POTASSIUM: 3.1 mmol/L — AB (ref 3.5–5.1)
SODIUM: 138 mmol/L (ref 135–145)
TOTAL PROTEIN: 6.2 g/dL — AB (ref 6.5–8.1)

## 2016-04-12 LAB — PROTIME-INR
INR: 0.99
INR: 1.11
PROTHROMBIN TIME: 13.1 s (ref 11.4–15.2)
Prothrombin Time: 14.4 seconds (ref 11.4–15.2)

## 2016-04-12 LAB — PHOSPHORUS: PHOSPHORUS: 3.1 mg/dL (ref 2.5–4.6)

## 2016-04-12 LAB — APTT
APTT: 25 s (ref 24–36)
APTT: 27 s (ref 24–36)

## 2016-04-12 LAB — CBG MONITORING, ED: GLUCOSE-CAPILLARY: 198 mg/dL — AB (ref 65–99)

## 2016-04-12 LAB — MRSA PCR SCREENING: MRSA by PCR: NEGATIVE

## 2016-04-12 LAB — MAGNESIUM: MAGNESIUM: 1.9 mg/dL (ref 1.7–2.4)

## 2016-04-12 MED ORDER — NIMODIPINE 60 MG/20ML PO SOLN
60.0000 mg | ORAL | Status: DC
  Administered 2016-04-12: 60 mg
  Filled 2016-04-12: qty 20

## 2016-04-12 MED ORDER — INSULIN ASPART 100 UNIT/ML ~~LOC~~ SOLN
0.0000 [IU] | SUBCUTANEOUS | Status: DC
  Administered 2016-04-12 – 2016-04-13 (×3): 1 [IU] via SUBCUTANEOUS
  Administered 2016-04-14: 2 [IU] via SUBCUTANEOUS
  Administered 2016-04-14 (×2): 1 [IU] via SUBCUTANEOUS

## 2016-04-12 MED ORDER — ORAL CARE MOUTH RINSE
15.0000 mL | OROMUCOSAL | Status: DC
  Administered 2016-04-12 – 2016-04-15 (×22): 15 mL via OROMUCOSAL

## 2016-04-12 MED ORDER — SODIUM CHLORIDE 0.9 % IV SOLN
30.0000 meq | Freq: Once | INTRAVENOUS | Status: AC
  Administered 2016-04-12: 30 meq via INTRAVENOUS
  Filled 2016-04-12: qty 15

## 2016-04-12 MED ORDER — NIMODIPINE 30 MG PO CAPS: 60.0000 mg | ORAL_CAPSULE | ORAL | Status: DC

## 2016-04-12 MED ORDER — ONDANSETRON 4 MG PO TBDP: 4.0000 mg | ORAL_TABLET | Freq: Four times a day (QID) | ORAL | Status: DC | PRN

## 2016-04-12 MED ORDER — FENTANYL CITRATE (PF) 100 MCG/2ML IJ SOLN: 50.0000 ug | INTRAMUSCULAR | Status: DC | PRN

## 2016-04-12 MED ORDER — PANTOPRAZOLE SODIUM 40 MG PO TBEC: 40.0000 mg | DELAYED_RELEASE_TABLET | Freq: Every day | ORAL | Status: DC

## 2016-04-12 MED ORDER — ACETAMINOPHEN 325 MG PO TABS: 650.0000 mg | ORAL_TABLET | ORAL | Status: DC | PRN

## 2016-04-12 MED ORDER — FAMOTIDINE IN NACL 20-0.9 MG/50ML-% IV SOLN
20.0000 mg | Freq: Two times a day (BID) | INTRAVENOUS | Status: DC
  Administered 2016-04-12 – 2016-04-14 (×4): 20 mg via INTRAVENOUS
  Filled 2016-04-12 (×4): qty 50

## 2016-04-12 MED ORDER — SODIUM CHLORIDE 0.9 % IV BOLUS (SEPSIS)
1000.0000 mL | Freq: Once | INTRAVENOUS | Status: AC
  Administered 2016-04-12: 1000 mL via INTRAVENOUS

## 2016-04-12 MED ORDER — SODIUM CHLORIDE 0.9 % IV SOLN
30.0000 meq | Freq: Once | INTRAVENOUS | Status: DC
  Filled 2016-04-12: qty 15

## 2016-04-12 MED ORDER — ETOMIDATE 2 MG/ML IV SOLN
INTRAVENOUS | Status: DC | PRN
  Administered 2016-04-12: 20 mg via INTRAVENOUS

## 2016-04-12 MED ORDER — IOPAMIDOL (ISOVUE-370) INJECTION 76%
INTRAVENOUS | Status: AC
  Administered 2016-04-12: 50 mL
  Filled 2016-04-12: qty 50

## 2016-04-12 MED ORDER — ACETAMINOPHEN 160 MG/5ML PO SOLN: 650.0000 mg | ORAL | Status: DC | PRN

## 2016-04-12 MED ORDER — PROPOFOL 1000 MG/100ML IV EMUL: 0.0000 ug/kg/min | INTRAVENOUS | Status: DC

## 2016-04-12 MED ORDER — ONDANSETRON HCL 4 MG/2ML IJ SOLN: 4.0000 mg | Freq: Four times a day (QID) | INTRAMUSCULAR | Status: DC | PRN

## 2016-04-12 MED ORDER — ASPIRIN 300 MG RE SUPP
150.0000 mg | Freq: Every day | RECTAL | Status: DC
  Administered 2016-04-12 – 2016-04-13 (×2): 150 mg via RECTAL
  Filled 2016-04-12 (×3): qty 1

## 2016-04-12 MED ORDER — LEVOTHYROXINE SODIUM 50 MCG PO TABS
50.0000 ug | ORAL_TABLET | Freq: Every day | ORAL | Status: DC
  Administered 2016-04-12 – 2016-04-14 (×3): 50 ug
  Filled 2016-04-12 (×3): qty 1

## 2016-04-12 MED ORDER — ACETAMINOPHEN 650 MG RE SUPP: 650.0000 mg | RECTAL | Status: DC | PRN

## 2016-04-12 MED ORDER — CHLORHEXIDINE GLUCONATE 0.12% ORAL RINSE (MEDLINE KIT)
15.0000 mL | Freq: Two times a day (BID) | OROMUCOSAL | Status: DC
  Administered 2016-04-12 – 2016-04-15 (×6): 15 mL via OROMUCOSAL

## 2016-04-12 MED ORDER — FENTANYL CITRATE (PF) 100 MCG/2ML IJ SOLN
50.0000 ug | INTRAMUSCULAR | Status: DC | PRN
  Filled 2016-04-12: qty 2

## 2016-04-12 MED ORDER — SODIUM CHLORIDE 0.9 % IV SOLN
INTRAVENOUS | Status: DC
  Administered 2016-04-12: 17:00:00 via INTRAVENOUS
  Administered 2016-04-13 – 2016-04-14 (×2): 1000 mL via INTRAVENOUS

## 2016-04-12 MED ORDER — PROPOFOL 1000 MG/100ML IV EMUL: 5.0000 ug/kg/min | Freq: Once | INTRAVENOUS | Status: DC

## 2016-04-12 MED ORDER — SUCCINYLCHOLINE CHLORIDE 20 MG/ML IJ SOLN
INTRAMUSCULAR | Status: DC | PRN
  Administered 2016-04-12: 100 mg via INTRAVENOUS

## 2016-04-12 MED ORDER — DOCUSATE SODIUM 100 MG PO CAPS: 100.0000 mg | ORAL_CAPSULE | Freq: Two times a day (BID) | ORAL | Status: DC

## 2016-04-12 MED ORDER — PANTOPRAZOLE SODIUM 40 MG PO PACK: 40.0000 mg | PACK | Freq: Every day | ORAL | Status: DC

## 2016-04-12 MED ORDER — STROKE: EARLY STAGES OF RECOVERY BOOK
Freq: Once | Status: AC
  Administered 2016-04-13: 1
  Filled 2016-04-12 (×2): qty 1

## 2016-04-12 NOTE — Progress Notes (Signed)
CRITICAL VALUE ALERT  Critical value received:  Troponin 0.19  Date of notification:  12/8  Time of notification:  1815  Critical value read back:Yes.    Nurse who received alert:  Francoise SchaumannSarah Kamora Vossler  MD notified (1st page):  ELINK   Time of first page: 1820   MD notified (2nd page):  Time of second page:  Responding MD:    Time MD responded:

## 2016-04-12 NOTE — ED Notes (Signed)
Friend who called 911 at bedside.  She has notified family and is writing down phone #'s so MD can call.

## 2016-04-12 NOTE — Progress Notes (Signed)
eLink Physician-Brief Progress Note Patient Name: Alyssa HintSally I Cowles DOB: [birth year] MRN: [id number]   Date of Service  04/14/2016  HPI/Events of Note  Troponin = 0.19 - EKG earlier today with T inversion c/w inferior ischemia.   eICU Interventions  Will order: 1. ASA 150 mg suppository PR now and Q day. 2. Continue to trend Troponin.      Intervention Category Intermediate Interventions: Diagnostic test evaluation  Lenell AntuSommer,Steven Eugene 05/03/2016, 6:44 PM

## 2016-04-12 NOTE — Progress Notes (Signed)
eLink Physician-Brief Progress Note Patient Name: Alyssa HintSally I Christensen DOB: May 02, 1931 MRN: [id number]   Date of Service  04/29/2016  HPI/Events of Note  Hypotension - s/p [place] at 6:30 PM.. BP = 97/39 with MAP = 55.  eICU Interventions  Will order: 1. 0.9 NaCl 1 liter IV over 1 hour now. 2. Will ask bedside nurse to establish BP goal parameters with Neurosurgery.         Miles Borkowski Dennard Nipugene 04/14/2016, 8:44 PM

## 2016-04-12 NOTE — Consult Note (Signed)
Reason for Consult: Subarachnoid hemorrhage, intraventricular hemorrhage, hydrocephalus Referring Physician: Dr. Willeen Cass I Riche is an 82 y.o. female.  HPI: The patient is an 82 year old white female who was found unconscious on the side of the road. She was brought to [facility] ER via EMS and intubated. She was worked up with a head CT which demonstrated a diffuse subarachnoid hemorrhage, intraventricular hemorrhage, and hydrocephalus. A CT angiogram was negative for obvious sources of bleeding. A neurosurgical consultation was requested.  Past Medical History:  Diagnosis Date  . Allergy   . Depression   . Essential hypertension   . Fibrocystic breast disease   . HLD (hyperlipidemia)   . Hypothyroidism   . Ischemic bowel disease ([place]) 2011  . MI (myocardial infarction)   . Osteoporosis   . Systolic dysfunction    a. PCP with reported echo from 2010 showing EF 40-45%, mod MR; b. pt reported nuc following 2010 echo was normal    Past Surgical History:  Procedure Laterality Date  . BREAST BIOPSY Bilateral    neg  . BREAST SURGERY     "to remove nodules that turned out to be benign"  . CARDIAC CATHETERIZATION N/A 03/19/2016   Procedure: Left Heart Cath and Coronary Angiography;  Surgeon: Nelva Bush, MD;  Location: [facility];  Service: Cardiovascular;  Laterality: N/A;  . foot surgeries     bilaterally to remove bunions  . varicose vein "stripped"     pt wears compression stocking now    Family History  Problem Relation Age of Onset  . Breast cancer Paternal Aunt 44  . Cancer Mother   . Cancer Father     Social History:  reports that she has never smoked. She has never used smokeless tobacco. She reports that she does not drink alcohol or use drugs.  Allergies:  Allergies  Allergen Reactions  . Ampicillin   . Codeine   . Mevacor [Lovastatin]   . Valium [Diazepam]     Medications:  I have reviewed the patient's current medications. Prior to  Admission:  (Not in a hospital admission) Scheduled: . potassium chloride (KCL MULTIRUN) 30 mEq in 265 mL IVPB  30 mEq Intravenous Once   Continuous: . propofol (DIPRIVAN) infusion     BHA:[id number], succinylcholine Anti-infectives    None       Results for orders placed or performed during the hospital encounter of 04/11/2016 (from the past 48 hour(s))  Protime-INR     Status: None   Collection Time: 05/02/2016  9:55 AM  Result Value Ref Range   Prothrombin Time 13.1 11.4 - 15.2 seconds   INR 0.99   APTT     Status: None   Collection Time: 04/08/2016  9:55 AM  Result Value Ref Range   aPTT 25 24 - 36 seconds  CBC     Status: Abnormal   Collection Time: 04/06/2016  9:55 AM  Result Value Ref Range   WBC 5.6 4.0 - 10.5 K/uL   RBC 3.79 (L) 3.87 - 5.11 MIL/uL   Hemoglobin 11.8 (L) 12.0 - 15.0 g/dL   HCT 34.8 (L) 36.0 - 46.0 %   MCV 91.8 78.0 - 100.0 fL   MCH 31.1 26.0 - 34.0 pg   MCHC 33.9 30.0 - 36.0 g/dL   RDW 15.4 11.5 - 15.5 %   Platelets 164 150 - 400 K/uL  Differential     Status: None   Collection Time: 04/27/2016  9:55 AM  Result Value Ref  Range   Neutrophils Relative % 56 %   Neutro Abs 3.2 1.7 - 7.7 K/uL   Lymphocytes Relative 28 %   Lymphs Abs 1.6 0.7 - 4.0 K/uL   Monocytes Relative 12 %   Monocytes Absolute 0.7 0.1 - 1.0 K/uL   Eosinophils Relative 3 %   Eosinophils Absolute 0.2 0.0 - 0.7 K/uL   Basophils Relative 1 %   Basophils Absolute 0.0 0.0 - 0.1 K/uL  Comprehensive metabolic panel     Status: Abnormal   Collection Time: 04/05/2016  9:55 AM  Result Value Ref Range   Sodium 138 135 - 145 mmol/L   Potassium 3.1 (L) 3.5 - 5.1 mmol/L   Chloride 103 101 - 111 mmol/L   CO2 23 22 - 32 mmol/L   Glucose, Bld 193 (H) 65 - 99 mg/dL   BUN 24 (H) 6 - 20 mg/dL   Creatinine, Ser 1.20 (H) 0.44 - 1.00 mg/dL   Calcium 9.1 8.9 - 10.3 mg/dL   Total Protein 6.2 (L) 6.5 - 8.1 g/dL   Albumin 3.7 3.5 - 5.0 g/dL   AST 32 15 - 41 U/L   ALT 21 14 - 54 U/L   Alkaline  Phosphatase 55 38 - 126 U/L   Total Bilirubin 1.1 0.3 - 1.2 mg/dL   GFR calc non Af Amer 41 (L) >60 mL/min   GFR calc Af Amer 47 (L) >60 mL/min    Comment: (NOTE) The eGFR has been calculated using the CKD EPI equation. This calculation has not been validated in all clinical situations. eGFR's persistently <60 mL/min signify possible Chronic Kidney Disease.    Anion gap 12 5 - 15  I-stat troponin, ED     Status: None   Collection Time: 04/23/2016  9:59 AM  Result Value Ref Range   Troponin i, poc 0.00 0.00 - 0.08 ng/mL   Comment 3            Comment: Due to the release kinetics of cTnI, a negative result within the first hours of the onset of symptoms does not rule out myocardial infarction with certainty. If myocardial infarction is still suspected, repeat the test at appropriate intervals.   CBG monitoring, ED     Status: Abnormal   Collection Time: 04/30/2016 10:00 AM  Result Value Ref Range   Glucose-Capillary 198 (H) 65 - 99 mg/dL   Comment 1 Notify RN    Comment 2 Document in Chart   I-Stat Chem 8, ED     Status: Abnormal   Collection Time: 04/07/2016 10:02 AM  Result Value Ref Range   Sodium 138 135 - 145 mmol/L   Potassium 3.2 (L) 3.5 - 5.1 mmol/L   Chloride 103 101 - 111 mmol/L   BUN 25 (H) 6 - 20 mg/dL   Creatinine, Ser 1.20 (H) 0.44 - 1.00 mg/dL   Glucose, Bld 195 (H) 65 - 99 mg/dL   Calcium, Ion 1.05 (L) 1.15 - 1.40 mmol/L   TCO2 23 0 - 100 mmol/L   Hemoglobin 11.9 (L) 12.0 - 15.0 g/dL   HCT 35.0 (L) 36.0 - 46.0 %  I-Stat arterial blood gas, ED     Status: Abnormal   Collection Time: 04/22/2016 11:28 AM  Result Value Ref Range   pH, Arterial 7.412 7.350 - 7.450   pCO2 arterial 40.7 32.0 - 48.0 mmHg   pO2, Arterial 158.0 (H) 83.0 - 108.0 mmHg   Bicarbonate 25.9 20.0 - 28.0 mmol/L   TCO2 27 0 -  100 mmol/L   O2 Saturation 99.0 %   Acid-Base Excess 1.0 0.0 - 2.0 mmol/L   Patient temperature HIDE    Sample type ARTERIAL     Ct Angio Head W Or Wo  Contrast  Result Date: 04/21/2016 CLINICAL DATA:  82 year old female with acute subarachnoid hemorrhage appearing aneurysmal related. Code stroke presentation. Initial encounter. EXAM: CT ANGIOGRAPHY HEAD TECHNIQUE: Multidetector CT imaging of the head was performed using the standard protocol during bolus administration of intravenous contrast. Multiplanar CT image reconstructions and MIPs were obtained to evaluate the vascular anatomy. CONTRAST:  50 mL Isovue 370 COMPARISON:  None. FINDINGS: CTA HEAD Posterior circulation: Patent but diminutive distal vertebral arteries and basilar artery. Fetal type left PCA origin. Normal right PCA origin. Right posterior communicating artery diminutive or absent. Right PICA, left AICA, and both SCA origins are patent. Anterior circulation: Patent in normal distal cervical ICAs. Both ICA siphons are patent. Calcified siphon atherosclerosis. No hemodynamically significant stenosis on either side. The left posterior communicating artery origin appears normal. Both ophthalmic artery origin regions appear normal. Both carotid termini appear normal. MCA and ACA origins are normal. Patent but diminutive A1 segments and visualized bilateral ACA branches. The anterior communicating artery appears diminutive and normal (series 7, image 98). Patent but somewhat diminutive bilateral MCA branches. Asymmetric early right MCA branching (series 11, image 17). Both M1 segments and MCA bifurcations otherwise appear within normal limits. No abnormal tangle of vessels identified anywhere to suggest AVM. Venous sinuses: Appear patent on the delayed phase image. Anatomic variants: Fetal type left PCA origin. Delayed phase: Large volume subarachnoid hemorrhage. Intraventricular hemorrhage with ventriculomegaly re- demonstrated. No abnormal enhancement identified. IMPRESSION: 1. No intracranial aneurysm identified on head CTA, but I suspect rupture from occult aneurysm as the source of the  subarachnoid hemorrhage. This was discussed by telephone with Dr. Roland Rack on 04/26/2016 at 10:57 . 2. Diffusely diminutive appearance of the anterior and posterior circulation likely due to subarachnoid hemorrhage related vasospasm. 3. Calcified ICA siphon atherosclerosis without stenosis. 4. Stable large volume subarachnoid hemorrhage, associated intraventricular hemorrhage, and ventriculomegaly. Electronically Signed   By: Genevie Ann M.D.   On: 04/14/2016 10:59   Dg Chest Portable 1 View  Result Date: 04/17/2016 CLINICAL DATA:  Endotracheal tube placement. EXAM: PORTABLE CHEST 1 VIEW COMPARISON:  Radiograph of March 19, 2016. FINDINGS: Stable cardiomediastinal silhouette. Atherosclerosis of thoracic aorta is noted. Interval placement of endotracheal tube with distal tip 5 cm above the carina. No pneumothorax or pleural effusion is noted. Bony thorax is unremarkable. IMPRESSION: Endotracheal tube in grossly good position. No acute cardiopulmonary abnormality seen. Aortic atherosclerosis. Electronically Signed   By: Marijo Conception, M.D.   On: 04/05/2016 10:26   Ct Head Code Stroke W/o Cm  Result Date: 04/29/2016 CLINICAL DATA:  Code stroke. 82 year old female with headache. Initial encounter. EXAM: CT HEAD WITHOUT CONTRAST TECHNIQUE: Contiguous axial images were obtained from the base of the skull through the vertex without intravenous contrast. COMPARISON:  None. FINDINGS: Brain: Relatively large volume of subarachnoid hemorrhage throughout the basilar cisterns. Fairly symmetric distribution. Intraventricular hemorrhage in the third and fourth ventricles mostly, small volume of lateral intraventricular hemorrhage near the foramina of [place]. There might also be a small volume of intraventricular hemorrhage in the right occipital horn. Ventriculomegaly with evidence of transependymal edema about the atria and occipital horns. No superimposed acute parenchymal hemorrhage or acute cortically based  infarct identified. No midline shift. There is an area of midline CSF preserved about  the cerebellar vermis. This might be related to mega cisterna magna variant or a small arachnoid cyst. Vascular: Calcified atherosclerosis at the skull base. Major vascular structures otherwise obscured by large volume subarachnoid hemorrhage. Skull: No acute or suspicious bone lesion identified. No skull fracture identified. Sinuses/Orbits: Clear. Other: Disc conjugate gaze but otherwise negative orbit soft tissues. No scalp hematoma or acute appearing scalp soft tissue finding. ASPECTS [facility] Stroke Program Early CT Score) Total score (0-10 with 10 being normal): Not applicable, intracranial hemorrhage. IMPRESSION: 1. Relatively large volume subarachnoid hemorrhage throughout the bilateral basilar cisterns. The pattern is indicative of acute rupture of an intracranial aneurysm. 2. Moderate volume intraventricular hemorrhage with evidence of acute ventriculomegaly. 3. ASPECTS is not applicable; acute intracranial hemorrhage. 4. The above was relayed via text pager to Dr. Roland Rack on 04/23/2016 at 10:40 . Electronically Signed   By: Genevie Ann M.D.   On: 04/22/2016 10:41    ROS: Unobtainable Blood pressure 168/65, pulse (!) 59, temperature (!) 95.2 F (35.1 C), temperature source Rectal, resp. rate 10, SpO2 100 %. Physical Exam  General: A thin 82 year old white female comatose on a ventilator in no apparent distress without obvious signs of trauma  HEENT: Normocephalic, atraumatic. Her pupils are approximately 4-5 mm and nonreactive. She has minimal corneal reflexes.  Neck: Supple without deformity  Thorax: Symmetric  Abdomen: Soft  Neurologic exam: Glasgow Coma Scale 6 intubated, [id number]. The patient will abnormally flex to painful stimuli bilaterally. Her pupils are large and nonreactive as above. She has minimal corneal reflexes. She has some respiratory effort.  I have reviewed the patient's head CT  performed without contrast today at [facility]. She has a diffuse subarachnoid hemorrhage, intraventricular hemorrhage, hydrocephalus. The CT angiogram does not demonstrate any obvious aneurysms.  Assessment/Plan: Nontraumatic subarachnoid hemorrhage, intraventricular hemorrhage, hydrocephalus: I have spoken with the patient's daughter Langley Gauss via the telephone. I have discussed the situation with her. I have explained that given her neurologic status and age her prognosis is poor. We have discussed the various treatment options including doing nothing versus continue supportive care and placement of a ventriculostomy to treat her hydrocephalus. I described that procedure to her. We have discussed the risks of the procedure including risks of hemorrhage, infection, ventriculostomy malfunction or malplacement, etc. I have answered all her questions. She has consented on behalf of her mother and wants to continue supportive care for now.  I have discussed the patient with Dr. Lamonte Sakai, critical care medicine, who has kindly agreed to admit the patient and manage her critical care needs.  Yazmeen Woolf D  04/23/2016, 1:52 PM

## 2016-04-12 NOTE — Code Documentation (Signed)
82yo [facility] at [phone].  Patient had sudden onset posterior headache at 0900.  She rode with a friend to the hospital but en route symptoms worsened prompting a call to EMS.  Fire arrived on the scene first noting patient in the passenger seat c/o headache and went to take patient's BP and right side became flaccid.  Patient with progressive unresponsiveness and code stroke activated by EMS.  Stroke team at the bedside on patient arrival.  Patient to Trauma C on arrival for intubation.  NIHSS 33, see documentation for details and code stroke times.  Patient nonverbal with posturing in BUE and withdraw in BLE to painful stimuli.  Pupils 2mm, nonreactive and absent corneals bilaterally.  Patient initially hypertensive.  Patient intubated by EDP.  Patient's BP decreasing without intervention, Cardene gtt not started per Dr. Amada JupiterKirkpatrick.  Patient to CT with team.  CT showing relatively large volume subarachnoid hemorrhage throughout the bilateral basilar cisterns and moderate volume intraventricular hemorrhage with evidence of acute ventriculomegaly.  CTA head completed per MD order.  Patient transferred back to Trauma C.  SBP<160 at that time.  NS consult.  Bedside handoff with ED RN Steward DroneBrenda.

## 2016-04-12 NOTE — Op Note (Signed)
Brief history: The patient is an 82 year old white female who was found unconscious this morning. She was intubated and worked up with a head CT which demonstrated a subarachnoid hemorrhage, intraventricular hemorrhage, and hydrocephalus. I discussed the situation with the patient's daughter. We discussed the various treatment options including placement of a ventriculostomy. The patient's daughter has weighed the risks, benefits, and alternatives to the procedure and has consented for placement of the ventriculostomy on behalf of her mother.  Preop diagnosis: Hydrocephalus, subarachnoid hemorrhage, intraventricular hemorrhage  Postop diagnosis: Same  Procedure: Placement of a right frontal ventriculostomy via burr hole  Surgeon: Dr. Delma OfficerJeff Tatia Petrucci  Assistant: None  Anesthesia: Local  Estimated blood loss: Minimal  Specimens: None  Complications: None  Description of procedure: The patient's scalp was shaved with clippers. Her right frontal region was then prepared with DuraPrep. Sterile drapes were applied. I injected the area to be incised with Marcaine with epinephrine solution. I said scalpel to make a incision in the midpupillary line at the coronal suture. We used a self-retaining retractor for exposure. I used the eggbeater drill to create a right frontal burr hole. I incised the dura with a 15 blade scalpel. I then cannulated the ventricular system with a ventriculostomy. There was release of cerebral spinal fluid under moderate pressure. I then tunneled the distal end of the ventriculostomy under the scalp and then connected to the drainage system. I sutured the ventriculostomy in place and secured the connections. We placed a sterile dressing. The drapes were removed. The patient tolerated the procedure well without change in her neurologic status.

## 2016-04-12 NOTE — ED Notes (Signed)
Spoke with critical care.  They did not receive the page RE admit.

## 2016-04-12 NOTE — ED Triage Notes (Signed)
Pt to ED  [facility] as an unresponsive possible stroke.  Pt's friend was driving her to the hospital b/c she stated she had a headache.  Pain became severe and friend pulled over to the side of the road and called 911.  When fire arrived pt was alert and speaking with sys in 215/119.  Soon after fire arrived pt became unresponsive with rr on 12, pupils pinpoint and unresponsive. Upon arrival, pt did not have gag reflex.  Making non-purposeful movement of both LE and L arm.

## 2016-04-12 NOTE — ED Notes (Signed)
Dr Amada JupiterKirkpatrick states to hold cardene and propofol.

## 2016-04-12 NOTE — ED Provider Notes (Signed)
[facility]EMER[facility]ENCY DEPT Provider Note   CSN: [id number] Arrival date & time: 2016/04/29  1191     History   Chief Complaint No chief complaint on file.   HPI Alyssa Christensen is a 82 y.o. female.Level V caveat altered mental status patient unresponsive history is obtained from paramedics and from notes accompanying patient. EMS reports that patient was found on the side of the road, pulled over. She had complained of a headache prior to becoming unresponsive. A note found on the patient's belongings, written this morning p stated that she had had left chest pain and left jaw pain early this morning. The note stated that She was on her way to the hospital. EMS obtained CBG which was 214. They briefly assisted respirations with bag valve mask. Treated patient with supplemental nasal prong oxygen. Code stroke called in the field.  HPI  Past Medical History:  Diagnosis Date  . Allergy   . Depression   . Essential hypertension   . Fibrocystic breast disease   . HLD (hyperlipidemia)   . Hypothyroidism   . Ischemic bowel disease (HCC) 2011  . Osteoporosis   . Systolic dysfunction    a. PCP with reported echo from 2010 showing EF 40-45%, mod MR; b. pt reported nuc following 2010 echo was normal    Patient Active Problem List   Diagnosis Date Noted  . Chest pain 03/19/2016  . NSTEMI (non-ST elevated myocardial infarction) (HCC) 03/19/2016  . Essential hypertension 03/19/2016  . Hypothyroidism 03/19/2016  . Osteoporosis 03/19/2016  . Fibrocystic breast disease 03/19/2016  . Hypokalemia 03/19/2016  . HLD (hyperlipidemia) 03/19/2016  . Systolic dysfunction 03/19/2016    Past Surgical History:  Procedure Laterality Date  . BREAST BIOPSY Bilateral    neg  . BREAST SURGERY     "to remove nodules that turned out to be benign"  . CARDIAC CATHETERIZATION N/A 03/19/2016   Procedure: Left Heart Cath and Coronary Angiography;  Surgeon: Yvonne Kendall, MD;  Location: [facility] INVASIVE CV LAB;   Service: Cardiovascular;  Laterality: N/A;  . foot surgeries     bilaterally to remove bunions  . varicose vein "stripped"     pt wears compression stocking now    OB History    No data available       Home Medications    Prior to Admission medications   Medication Sig Start Date End Date Taking? Authorizing Provider  aspirin 81 MG chewable tablet Chew 81 mg by mouth daily.    Historical Provider, MD  atorvastatin (LIPITOR) 40 MG tablet Take 1 tablet (40 mg total) by mouth daily at 6 PM. 04/02/16   Yvonne Kendall, MD  calcium-vitamin D (SM CALCIUM 500/VITAMIN D3) 500-400 MG-UNIT tablet Take by mouth.    Historical Provider, MD  clopidogrel (PLAVIX) 75 MG tablet Take 1 tablet (75 mg total) by mouth daily. 04/02/16   Yvonne Kendall, MD  levothyroxine (SYNTHROID, LEVOTHROID) 50 MCG tablet TAKE ONE TABLET BY MOUTH ONCE DAILY. TAKE ON AN EMPTY STOMACH WITH A GLASS OF WATER AT LEAST 30-60 MINUTES BEFORE BREAKFAST 01/09/16   Historical Provider, MD  losartan (COZAAR) 25 MG tablet TAKE ONE TABLET BY MOUTH ONCE DAILY 01/22/16   Historical Provider, MD  metoprolol (LOPRESSOR) 50 MG tablet Take 1 tablet (50 mg total) by mouth 2 (two) times daily. 04/02/16 07/01/16  Yvonne Kendall, MD  Multiple Vitamin (MULTI-VITAMINS) TABS Take by mouth.    Historical Provider, MD  nitroGLYCERIN (NITROSTAT) 0.4 MG SL tablet Place 1 tablet (0.4 mg  total) under the tongue every 5 (five) minutes as needed for chest pain. 03/21/16   Alford Highlandichard Wieting, MD    Family History Family History  Problem Relation Age of Onset  . Breast cancer Paternal Aunt [id number]  . Cancer Mother   . Cancer Father     Social History Social History  Substance Use Topics  . Smoking status: Never Smoker  . Smokeless tobacco: Never Used  . Alcohol use No     Allergies   Ampicillin; Codeine; Mevacor [lovastatin]; and Valium [diazepam]   Review of Systems Review of Systems  Unable to perform ROS: Acuity of condition   Patient  unresponsive  Physical Exam Updated Vital Signs BP 173/77   Pulse 65   Resp 16   SpO2 100%   Physical Exam  Constitutional: She appears well-developed and well-nourished.  Unresponsive. Nonpurposeful movement to noxious stimulus.  HENT:  Head: Normocephalic and atraumatic.  Eyes: Pupils are equal, round, and reactive to light.  Neck: Neck supple. No tracheal deviation present. No thyromegaly present.  Cardiovascular: Normal rate and regular rhythm.   No murmur heard. Pulmonary/Chest: Effort normal and breath sounds normal.  Abdominal: Soft. Bowel sounds are normal. She exhibits no distension. There is no tenderness.  Musculoskeletal: Normal range of motion. She exhibits no edema or tenderness.  Neurological: She is alert. Coordination normal.  Unresponsive  Glasgow Coma Score 6. Gag reflex absent  Skin: Skin is warm and dry. No rash noted.  Nursing note and vitals reviewed.    ED Treatments / Results  Labs (all labs ordered are listed, but only abnormal results are displayed) Labs Reviewed  CBC - Abnormal; Notable for the following:       Result Value   RBC 3.79 (*)    Hemoglobin 11.8 (*)    HCT 34.8 (*)    All other components within normal limits  CBG MONITORING, ED - Abnormal; Notable for the following:    Glucose-Capillary 198 (*)    All other components within normal limits  I-STAT CHEM 8, ED - Abnormal; Notable for the following:    Potassium 3.2 (*)    BUN 25 (*)    Creatinine, Ser 1.20 (*)    Glucose, Bld 195 (*)    Calcium, Ion 1.05 (*)    Hemoglobin 11.9 (*)    HCT 35.0 (*)    All other components within normal limits  DIFFERENTIAL  PROTIME-INR  APTT  COMPREHENSIVE METABOLIC PANEL  I-STAT TROPOININ, ED    EKG  EKG Interpretation  Date/Time:  Friday April 12 2016 10:12:02 EST Ventricular Rate:  69 PR Interval:    QRS Duration: 98 QT Interval:  445 QTC Calculation: 477 R Axis:   73 Text Interpretation:  Sinus arrhythmia Multiple premature  complexes, vent & supraven Abnormal T, consider ischemia, inferior leads New since previous tracing Confirmed by Ethelda ChickJACUBOWITZ  MD, Fidela Cieslak [phone]([id number]) on 04/17/2016 10:36:47 AM      Results for orders placed or performed during the hospital encounter of 08-27-2015  Protime-INR  Result Value Ref Range   Prothrombin Time 13.1 11.4 - 15.2 seconds   INR 0.99   APTT  Result Value Ref Range   aPTT 25 24 - 36 seconds  CBC  Result Value Ref Range   WBC 5.6 4.0 - 10.5 K/uL   RBC 3.79 (L) 3.87 - 5.11 MIL/uL   Hemoglobin 11.8 (L) 12.0 - 15.0 g/dL   HCT 91.434.8 (L) 78.236.0 - 95.646.0 %   MCV 91.8 78.0 - 100.0  fL   MCH 31.1 26.0 - 34.0 pg   MCHC 33.9 30.0 - 36.0 g/dL   RDW 40.315.4 47.411.5 - 25.915.5 %   Platelets 164 150 - 400 K/uL  Differential  Result Value Ref Range   Neutrophils Relative % 56 %   Neutro Abs 3.2 1.7 - 7.7 K/uL   Lymphocytes Relative 28 %   Lymphs Abs 1.6 0.7 - 4.0 K/uL   Monocytes Relative 12 %   Monocytes Absolute 0.7 0.1 - 1.0 K/uL   Eosinophils Relative 3 %   Eosinophils Absolute 0.2 0.0 - 0.7 K/uL   Basophils Relative 1 %   Basophils Absolute 0.0 0.0 - 0.1 K/uL  Comprehensive metabolic panel  Result Value Ref Range   Sodium 138 135 - 145 mmol/L   Potassium 3.1 (L) 3.5 - 5.1 mmol/L   Chloride 103 101 - 111 mmol/L   CO2 23 22 - 32 mmol/L   Glucose, Bld 193 (H) 65 - 99 mg/dL   BUN 24 (H) 6 - 20 mg/dL   Creatinine, Ser 5.631.20 (H) 0.44 - 1.00 mg/dL   Calcium 9.1 8.9 - 87.510.3 mg/dL   Total Protein 6.2 (L) 6.5 - 8.1 g/dL   Albumin 3.7 3.5 - 5.0 g/dL   AST 32 15 - 41 U/L   ALT 21 14 - 54 U/L   Alkaline Phosphatase 55 38 - 126 U/L   Total Bilirubin 1.1 0.3 - 1.2 mg/dL   GFR calc non Af Amer 41 (L) >60 mL/min   GFR calc Af Amer 47 (L) >60 mL/min   Anion gap 12 5 - 15  I-stat troponin, ED  Result Value Ref Range   Troponin i, poc 0.00 0.00 - 0.08 ng/mL   Comment 3          CBG monitoring, ED  Result Value Ref Range   Glucose-Capillary 198 (H) 65 - 99 mg/dL   Comment 1 Notify RN    Comment 2  Document in Chart   I-Stat Chem 8, ED  Result Value Ref Range   Sodium 138 135 - 145 mmol/L   Potassium 3.2 (L) 3.5 - 5.1 mmol/L   Chloride 103 101 - 111 mmol/L   BUN 25 (H) 6 - 20 mg/dL   Creatinine, Ser 6.431.20 (H) 0.44 - 1.00 mg/dL   Glucose, Bld 329195 (H) 65 - 99 mg/dL   Calcium, Ion 5.181.05 (L) 1.15 - 1.40 mmol/L   TCO2 23 0 - 100 mmol/L   Hemoglobin 11.9 (L) 12.0 - 15.0 g/dL   HCT 84.135.0 (L) 66.036.0 - 63.046.0 %   Dg Chest 2 View  Result Date: 03/19/2016 CLINICAL DATA:  Patient reports left side chest pain onset this morning. Pain has since resolved. Hx CHF, COPD, HTN, breast biopsy. Non-smoker. EXAM: CHEST  2 VIEW COMPARISON:  10/01/2011 FINDINGS: The cardiac silhouette is normal in size and configuration. Normal mediastinal and hilar contours. Lungs are mildly hyperexpanded but clear. No pleural effusion or pneumothorax. Skeletal structures are demineralized but intact. IMPRESSION: No acute cardiopulmonary disease. Electronically Signed   By: Amie Portlandavid  Ormond M.D.   On: 03/19/2016 08:18   Dg Chest Portable 1 View  Result Date: 05/05/2016 CLINICAL DATA:  Endotracheal tube placement. EXAM: PORTABLE CHEST 1 VIEW COMPARISON:  Radiograph of March 19, 2016. FINDINGS: Stable cardiomediastinal silhouette. Atherosclerosis of thoracic aorta is noted. Interval placement of endotracheal tube with distal tip 5 cm above the carina. No pneumothorax or pleural effusion is noted. Bony thorax is unremarkable. IMPRESSION: Endotracheal tube in  grossly good position. No acute cardiopulmonary abnormality seen. Aortic atherosclerosis. Electronically Signed   By: Lupita Raider, M.D.   On: 2016-04-19 10:26   Ct Head Code Stroke W/o Cm  Result Date: 2016-04-19 CLINICAL DATA:  Code stroke. 82 year old female with headache. Initial encounter. EXAM: CT HEAD WITHOUT CONTRAST TECHNIQUE: Contiguous axial images were obtained from the base of the skull through the vertex without intravenous contrast. COMPARISON:  None. FINDINGS:  Brain: Relatively large volume of subarachnoid hemorrhage throughout the basilar cisterns. Fairly symmetric distribution. Intraventricular hemorrhage in the third and fourth ventricles mostly, small volume of lateral intraventricular hemorrhage near the foramina of [place]. There might also be a small volume of intraventricular hemorrhage in the right occipital horn. Ventriculomegaly with evidence of transependymal edema about the atria and occipital horns. No superimposed acute parenchymal hemorrhage or acute cortically based infarct identified. No midline shift. There is an area of midline CSF preserved about the cerebellar vermis. This might be related to mega cisterna magna variant or a small arachnoid cyst. Vascular: Calcified atherosclerosis at the skull base. Major vascular structures otherwise obscured by large volume subarachnoid hemorrhage. Skull: No acute or suspicious bone lesion identified. No skull fracture identified. Sinuses/Orbits: Clear. Other: Disc conjugate gaze but otherwise negative orbit soft tissues. No scalp hematoma or acute appearing scalp soft tissue finding. ASPECTS [facility] Stroke Program Early CT Score) Total score (0-10 with 10 being normal): Not applicable, intracranial hemorrhage. IMPRESSION: 1. Relatively large volume subarachnoid hemorrhage throughout the bilateral basilar cisterns. The pattern is indicative of acute rupture of an intracranial aneurysm. 2. Moderate volume intraventricular hemorrhage with evidence of acute ventriculomegaly. 3. ASPECTS is not applicable; acute intracranial hemorrhage. 4. The above was relayed via text pager to Dr. Ritta Slot on 04/19/2016 at 10:40 . Electronically Signed   By: Odessa Fleming M.D.   On: 2016-04-19 10:41    Radiology No results found.  Procedures Procedure Name: Intubation Date/Time: 04-19-16 10:05 AM Performed by: Doug Sou Pre-anesthesia Checklist: Patient identified, Emergency Drugs available, Suction available,  Patient being monitored and Timeout performed Oxygen Delivery Method: Ambu bag Preoxygenation: Pre-oxygenation with 100% oxygen Intubation Type: Rapid sequence and Cricoid Pressure applied Ventilation: Mask ventilation without difficulty Laryngoscope Size: Mac and 3 Grade View: Grade II Tube type: Subglottic suction tube Tube size: 7.5 mm Number of attempts: 1 Placement Confirmation: ETT inserted through vocal cords under direct vision,  Positive ETCO2 and Breath sounds checked- equal and bilateral Secured at: 23 cm Dental Injury: Teeth and Oropharynx as per pre-operative assessment       (including critical care time)  Medications Ordered in ED Medications  etomidate (AMIDATE) injection (20 mg Intravenous Given April 19, 2016 1005)  succinylcholine (ANECTINE) injection (100 mg Intravenous Given 04/19/2016 1005)  propofol (DIPRIVAN) 1000 MG/100ML infusion (not administered)     Initial Impression / Assessment and Plan / ED Course  I have reviewed the triage vital signs and the nursing notes.  Pertinent labs & imaging results that were available during my care of the patient were reviewed by me and considered in my medical decision making (see chart for details).  Clinical Course   Immediately post intubation patient placed on ventilator.. Intravenous Propofol drip ordered for sedation. Intravenous Cardene drip ordered by Dr. Amada Jupiter was present upon patient's arrival. Neurosurgery consulted at Dr. Alene Mires request as patient has subarachnoid hemorrhage  . Dr. Lovell Sheehan from neurosurgery service consulted and will evaluate patient in ED. At 11 AM patient is hemodynamically stable Intravenous potassium ordered Final Clinical Impressions(s) /  ED Diagnoses   Final diagnoses:  None  Diagnosis #1 subarachnoid hemorrhage #2-hypokalemia 3 hyperglycemia #4 renal insufficiency CRITICAL CARE Performed by: Doug Sou Total critical care time: 40 minutes Critical care time was  exclusive of separately billable procedures and treating other patients. Critical care was necessary to treat or prevent imminent or life-threatening deterioration. Critical care was time spent personally by me on the following activities: development of treatment plan with patient and/or surrogate as well as nursing, discussions with consultants, evaluation of patient's response to treatment, examination of patient, obtaining history from patient or surrogate, ordering and performing treatments and interventions, ordering and review of laboratory studies, ordering and review of radiographic studies, pulse oximetry and re-evaluation of patient's condition.  New Prescriptions New Prescriptions   No medications on file     Doug Sou, MD 04/20/2016 1109

## 2016-04-12 NOTE — ED Notes (Signed)
Dr Ethelda ChickJacubowitz spoke with neurosurgery, Dr Lovell SheehanJenkins, and he is coming.

## 2016-04-12 NOTE — Progress Notes (Signed)
Critical troponin 0.19 [facility] and relayed to Dr Arsenio LoaderSommer.

## 2016-04-12 NOTE — Progress Notes (Signed)
Pt. Was transported to 3M08 without any complications.

## 2016-04-12 NOTE — ED Notes (Signed)
Patient transported to CT 

## 2016-04-12 NOTE — ED Notes (Signed)
Dr Lovell SheehanJenkins placing ventriculostomy at bedside with neuro ICU RN's.

## 2016-04-12 NOTE — H&P (Signed)
PULMONARY / CRITICAL CARE MEDICINE   Name: Alyssa Christensen MRN: [id number] DOB: 27-May-1930    ADMISSION DATE:  2016-05-05 CONSULTATION DATE:  12/8  REFERRING MD:  Lovell Sheehan  CHIEF COMPLAINT:  SAH  HISTORY OF PRESENT ILLNESS:   This is a 82 year old female w/ PMH of CAD (had recent NSTEMI 2017) and hypothyroidism. Was in usual state of health until am of 12/8 started having some chest discomfort. Called a friend. On way to ER developed "severe head ache". They pulled over. Called EMS. In less than 5 minutes she was minimally responsive. She arrived in the ER w/ GCS about 5. Was intubated for airway protection and CT head was obtained. This demonstrated severe SAH w/ Intraventricular extension as well as obstructive hydrocephalus. She was seen by neuro-surg who placed a IVC and then PCCM asked to admit.   PAST MEDICAL HISTORY :  She  has a past medical history of Allergy; Depression; Essential hypertension; Fibrocystic breast disease; HLD (hyperlipidemia); Hypothyroidism; Ischemic bowel disease (HCC) (2011); MI (myocardial infarction); Osteoporosis; and Systolic dysfunction.  PAST SURGICAL HISTORY: She  has a past surgical history that includes Breast surgery; foot surgeries; varicose vein "stripped"; Breast biopsy (Bilateral); and Cardiac catheterization (N/A, 03/19/2016).  Allergies  Allergen Reactions  . Ampicillin   . Codeine   . Mevacor [Lovastatin]   . Valium [Diazepam]     No current facility-administered medications on file prior to encounter.    Current Outpatient Prescriptions on File Prior to Encounter  Medication Sig  . clopidogrel (PLAVIX) 75 MG tablet Take 1 tablet (75 mg total) by mouth daily.  Marland Kitchen levothyroxine (SYNTHROID, LEVOTHROID) 50 MCG tablet TAKE ONE TABLET BY MOUTH ONCE DAILY. TAKE ON AN EMPTY STOMACH WITH A GLASS OF WATER AT LEAST 30-60 MINUTES BEFORE BREAKFAST  . metoprolol (LOPRESSOR) 50 MG tablet Take 1 tablet (50 mg total) by mouth 2 (two) times daily.  .  nitroGLYCERIN (NITROSTAT) 0.4 MG SL tablet Place 1 tablet (0.4 mg total) under the tongue every 5 (five) minutes as needed for chest pain.  Marland Kitchen aspirin 81 MG chewable tablet Chew 81 mg by mouth daily.  Marland Kitchen atorvastatin (LIPITOR) 40 MG tablet Take 1 tablet (40 mg total) by mouth daily at 6 PM. (Patient not taking: Reported on 05/05/16)  . calcium-vitamin D (SM CALCIUM 500/VITAMIN D3) 500-400 MG-UNIT tablet Take by mouth.  . Multiple Vitamin (MULTI-VITAMINS) TABS Take by mouth.    FAMILY HISTORY:  Her indicated that her mother is deceased. She indicated that her father is deceased. She indicated that the status of her paternal aunt is unknown.    SOCIAL HISTORY: She  reports that she has never smoked. She has never used smokeless tobacco. She reports that she does not drink alcohol or use drugs.  REVIEW OF SYSTEMS:   Unable   SUBJECTIVE:  Critically ill  VITAL SIGNS: BP 135/61   Pulse 72   Temp (!) 95.2 F (35.1 C) (Rectal)   Resp 18   SpO2 100%   HEMODYNAMICS:    VENTILATOR SETTINGS: Vent Mode: PRVC FiO2 (%):  [30 %-40 %] 30 % Set Rate:  [16 bmp] 16 bmp Vt Set:  [400 mL] 400 mL PEEP:  [5 cmH20] 5 cmH20 Plateau Pressure:  [12 cmH20-13 cmH20] 13 cmH20  INTAKE / OUTPUT: No intake/output data recorded.  PHYSICAL EXAMINATION: General:  Elderly 82 year old sedated on vent Neuro:  Sedated. Pupils sluggish. Decorticate posturing noted  HEENT:  Orally intubated. MMM Cardiovascular:  RRR w/out MRG  Lungs:  Scattered rhonchi. Equal chest rise  Abdomen:  Soft, not tender + bowel sounds Musculoskeletal:  Equal bulk Skin:  Warm and dry. Scattered areas of ecchymosis.   LABS:  BMET  Recent Labs Lab 04/21/2016 0955 04/07/2016 1002  NA 138 138  K 3.1* 3.2*  CL 103 103  CO2 23  --   BUN 24* 25*  CREATININE 1.20* 1.20*  GLUCOSE 193* 195*    Electrolytes  Recent Labs Lab 04/14/2016 0955  CALCIUM 9.1    CBC  Recent Labs Lab 04/07/2016 0955 04/05/2016 1002  WBC 5.6  --    HGB 11.8* 11.9*  HCT 34.8* 35.0*  PLT 164  --     Coag's  Recent Labs Lab 04/17/2016 0955  APTT 25  INR 0.99    Sepsis Markers No results for input(s): LATICACIDVEN, PROCALCITON, O2SATVEN in the last 168 hours.  ABG  Recent Labs Lab 04/27/2016 1128  PHART 7.412  PCO2ART 40.7  PO2ART 158.0*    Liver Enzymes  Recent Labs Lab 05/01/2016 0955  AST 32  ALT 21  ALKPHOS 55  BILITOT 1.1  ALBUMIN 3.7    Cardiac Enzymes No results for input(s): TROPONINI, PROBNP in the last 168 hours.  Glucose  Recent Labs Lab 05/03/2016 1000  GLUCAP 198*    Imaging Ct Angio Head W Or Wo Contrast  Result Date: 04/09/2016 CLINICAL DATA:  82 year old female with acute subarachnoid hemorrhage appearing aneurysmal related. Code stroke presentation. Initial encounter. EXAM: CT ANGIOGRAPHY HEAD TECHNIQUE: Multidetector CT imaging of the head was performed using the standard protocol during bolus administration of intravenous contrast. Multiplanar CT image reconstructions and MIPs were obtained to evaluate the vascular anatomy. CONTRAST:  50 mL Isovue 370 COMPARISON:  None. FINDINGS: CTA HEAD Posterior circulation: Patent but diminutive distal vertebral arteries and basilar artery. Fetal type left PCA origin. Normal right PCA origin. Right posterior communicating artery diminutive or absent. Right PICA, left AICA, and both SCA origins are patent. Anterior circulation: Patent in normal distal cervical ICAs. Both ICA siphons are patent. Calcified siphon atherosclerosis. No hemodynamically significant stenosis on either side. The left posterior communicating artery origin appears normal. Both ophthalmic artery origin regions appear normal. Both carotid termini appear normal. MCA and ACA origins are normal. Patent but diminutive A1 segments and visualized bilateral ACA branches. The anterior communicating artery appears diminutive and normal (series 7, image 98). Patent but somewhat diminutive bilateral MCA  branches. Asymmetric early right MCA branching (series 11, image 17). Both M1 segments and MCA bifurcations otherwise appear within normal limits. No abnormal tangle of vessels identified anywhere to suggest AVM. Venous sinuses: Appear patent on the delayed phase image. Anatomic variants: Fetal type left PCA origin. Delayed phase: Large volume subarachnoid hemorrhage. Intraventricular hemorrhage with ventriculomegaly re- demonstrated. No abnormal enhancement identified. IMPRESSION: 1. No intracranial aneurysm identified on head CTA, but I suspect rupture from occult aneurysm as the source of the subarachnoid hemorrhage. This was discussed by telephone with Dr. Ritta SlotMcNeill Kirkpatrick on 04/06/2016 at 10:57 . 2. Diffusely diminutive appearance of the anterior and posterior circulation likely due to subarachnoid hemorrhage related vasospasm. 3. Calcified ICA siphon atherosclerosis without stenosis. 4. Stable large volume subarachnoid hemorrhage, associated intraventricular hemorrhage, and ventriculomegaly. Electronically Signed   By: Odessa FlemingH  Hall M.D.   On: 04/23/2016 10:59   Dg Chest Portable 1 View  Result Date: 04/11/2016 CLINICAL DATA:  Endotracheal tube placement. EXAM: PORTABLE CHEST 1 VIEW COMPARISON:  Radiograph of March 19, 2016. FINDINGS: Stable cardiomediastinal silhouette. Atherosclerosis of  thoracic aorta is noted. Interval placement of endotracheal tube with distal tip 5 cm above the carina. No pneumothorax or pleural effusion is noted. Bony thorax is unremarkable. IMPRESSION: Endotracheal tube in grossly good position. No acute cardiopulmonary abnormality seen. Aortic atherosclerosis. Electronically Signed   By: Lupita RaiderJames  Green Jr, M.D.   On: 04/25/2016 10:26   Ct Head Code Stroke W/o Cm  Result Date: 05/05/2016 CLINICAL DATA:  Code stroke. 82 year old female with headache. Initial encounter. EXAM: CT HEAD WITHOUT CONTRAST TECHNIQUE: Contiguous axial images were obtained from the base of the skull through  the vertex without intravenous contrast. COMPARISON:  None. FINDINGS: Brain: Relatively large volume of subarachnoid hemorrhage throughout the basilar cisterns. Fairly symmetric distribution. Intraventricular hemorrhage in the third and fourth ventricles mostly, small volume of lateral intraventricular hemorrhage near the foramina of [place]. There might also be a small volume of intraventricular hemorrhage in the right occipital horn. Ventriculomegaly with evidence of transependymal edema about the atria and occipital horns. No superimposed acute parenchymal hemorrhage or acute cortically based infarct identified. No midline shift. There is an area of midline CSF preserved about the cerebellar vermis. This might be related to mega cisterna magna variant or a small arachnoid cyst. Vascular: Calcified atherosclerosis at the skull base. Major vascular structures otherwise obscured by large volume subarachnoid hemorrhage. Skull: No acute or suspicious bone lesion identified. No skull fracture identified. Sinuses/Orbits: Clear. Other: Disc conjugate gaze but otherwise negative orbit soft tissues. No scalp hematoma or acute appearing scalp soft tissue finding. ASPECTS [facility]([place] Stroke Program Early CT Score) Total score (0-10 with 10 being normal): Not applicable, intracranial hemorrhage. IMPRESSION: 1. Relatively large volume subarachnoid hemorrhage throughout the bilateral basilar cisterns. The pattern is indicative of acute rupture of an intracranial aneurysm. 2. Moderate volume intraventricular hemorrhage with evidence of acute ventriculomegaly. 3. ASPECTS is not applicable; acute intracranial hemorrhage. 4. The above was relayed via text pager to Dr. Ritta SlotMcNeill Kirkpatrick on 04/11/2016 at 10:40 . Electronically Signed   By: Odessa FlemingH  Hall M.D.   On: 04/11/2016 10:41     STUDIES:  Ct head 12/8: 1. Relatively large volume subarachnoid hemorrhage throughout the bilateral basilar cisterns. The pattern is indicative of acute  rupture of an intracranial aneurysm. 2. Moderate volume intraventricular hemorrhage with evidence of acute ventriculomegaly. CT angio 12/8: 1. No intracranial aneurysm identified on head CTA, but suspect rupture from occult aneurysm as the source of the subarachnoid. 2. Diffusely diminutive appearance of the anterior and posterior circulation likely due to subarachnoid hemorrhage related vasospasm. 3. Calcified ICA siphon atherosclerosis without stenosis. 4. Stable large volume subarachnoid hemorrhage, associated intraventricular hemorrhage, and ventriculomegaly.  CULTURES:   ANTIBIOTICS:   SIGNIFICANT EVENTS:   LINES/TUBES: oett 12/8>>> IVC 12/8>>>  DISCUSSION: [age] yof w/ cad and nstemi in Nov 2017. Admitted w/ H&H grade 5 SAH, IVH and obstructive hydrocephalus. Intubated for airway protection. Now has IVC. Will admit to Neuro ICU. SAH protocol order set initiated.   ASSESSMENT / PLAN:  NEUROLOGIC A:   COMA SAH H&H score 5; w/ IVH and obstructive hydrocephalus s/p IVC cath per neuro P:   RASS goal: -1 to -2  Propofol and PRN fent SAH admission set  Starting nimodipine  Avoid SBP >160  PULMONARY A: Ventilator dependence s/p SAH P:   Full vent support PAD and VAP protocol  F/u cxr and abg in am Avoid hyperventilation   CARDIOVASCULAR A:  CAD w/ recent NSTEMI in Nov 2017 Most recent ECHO 50% P:  Dc plavix  12  lead Tele Cycle CEs  RENAL A:   AKI Hypokalemia  P:   Gentle hydration Replace K Strict I&O Renal dose meds   GASTROINTESTINAL A:   No acute P:   H2B for SUP npo  HEMATOLOGIC A:   Anemia  P:  PAS to LEs No heparin Dc asa and plavix   INFECTIOUS A:   No acute  P:   Trend fever curve   ENDOCRINE A:   Hyperglycemia   Hypothyroidism  P:   ssi  Cont synthroid    FAMILY  - Updates:   - Inter-disciplinary family meet or Palliative Care meeting due by: 12/15  My ccm time 40 minutes Simonne Martinet ACNP-BC [facility]  Pulmonary/Critical Care Pager # [phone] OR # [phone] if no answer 2016-04-29, 3:05 PM  Attending Note:  I have examined patient, reviewed labs, studies and notes. I have discussed the case with Kreg Shropshire, and I agree with the data and plans as amended above.  82 yo woman, hx CAD and recent NSTEMI, on plavix. admittd with acute change in LOC, SAH with IV hemorrhage and coma. Intubated for airway protection. IVC drain placed by NSGY, but note that prognosis for meaningful recovery here is very poor. On my eval she is intubated, unresponsive. BP is at our goal of 130-150. Lungs clear. No significant edema. IVC is in place and appears to be draining appropriately. We will plan to keep her comfortable, intubated. Nimodipine initiated. Will keep SBP < 160. Plavix held. Unfortunately she likely cannot survive this injury. Will plan to discuss her status and options for further care with her family - they are in route from [place].  Independent critical care time is 35 minutes.   Levy Pupa, MD, PhD 2016-04-29, 5:11 PM [facility] [phone] or if no answer [phone]

## 2016-04-12 NOTE — ED Notes (Signed)
Pt's CBG 198.  Informed Steward DroneBrenda, RN.

## 2016-04-12 NOTE — Consult Note (Signed)
Requesting Physician: Dr. Ethelda ChickJacubowitz    Chief Complaint: Unresponsiveness  History obtained from:  Chart and EMS  HPI:                                                                                                                                         Alyssa Christensen is an 82 y.o. female with PMHx of HTN, HLD, Hypothyroidism, Chronic HFrEF and recent NSTEMI in November 2017 on DAPT with ASA and Plavix who was on the way to the hospital with a friend due a severe posterior headache. The friend pulled over due to increasing severity of the headache and called EMS. On arrival of EMS, patient was talking and stated she had severe pain in her posterior head. EMS went to take her blood pressure and as she lifted her arm, patient went limp and unresponsive. EMS reports she was unresponsive, posturing and decorticate.   On review of patient's belongings, patient had written a note to her children this morning explaining her symptoms. In the letter from 05/02/2016 at 0604, patient states that "at 0500 this morning, after getting up to use the bathroom- I experienced pain in/under my left breast. Though lessening in intensity, it continued for about twenty minutes. I dithered for quite a while trying to decide what to do - finally decided to wait and see. I took my blood pressure at 0547 - 146/70. (The stats are in the BP Monitor)...at present I am not experiencing pain, but there's a definite 'pressure' in the left jaw and neck, and a slight numbness in my left lower lip. I experienced something similar at the time of my heart attack last month and mentioned it to medical personnel at the time. But nobody seemed to be aware of or, at least failed to confirm - that women often experience this phenomenon as a side effect of heart problems. I have felt a similar sensation in the past, but it did not manifest as a heart attack on those occasions. Right now, at 0644 Im not hurting - just shaky. The numbness persists, but  seems to be lessening. If it continues, though, I may go to the walk in clinic for an official opinion..."  In the ED, patient was hypertensive to 212/120 with decreased respirations, given respiratory depression and GCS, patient was intubated in the ED and placed on propofol. CT Head so contrast showed a Subarachnoid Hemorrhage. EKG showed TWI in leads II, III, and AVF. EKG from 11/28 showed TWI in III, AVF and V4, V5, V6.   Date last known well: Date: 04/25/2016 Time last known well: Time: 09:05 tPA Given: No: Subarachnoid Hemorrhage No Symptoms         0 No significant disability/able to carry out all usual activities   1 Unable to carry out all previous activities but looks after own affairs  2 Requires help but walks without assistance     3 Unable to walk without assistance/unable to handle own bodily needs 4 Bedridden/incontinent        5 Dead          6  Modified Rankin: Rankin Score=5  Hunt and Hess Score: 5 5 due to comatose, posturing of left arm, with severe neurological defecit  Past Medical History:  Diagnosis Date  . Allergy   . Depression   . Essential hypertension   . Fibrocystic breast disease   . HLD (hyperlipidemia)   . Hypothyroidism   . Ischemic bowel disease (HCC) 2011  . Osteoporosis   . Systolic dysfunction    a. PCP with reported echo from 2010 showing EF 40-45%, mod MR; b. pt reported nuc following 2010 echo was normal    Past Surgical History:  Procedure Laterality Date  . BREAST BIOPSY Bilateral    neg  . BREAST SURGERY     "to remove nodules that turned out to be benign"  . CARDIAC CATHETERIZATION N/A 03/19/2016   Procedure: Left Heart Cath and Coronary Angiography;  Surgeon: Yvonne Kendall, MD;  Location: [facility] INVASIVE CV LAB;  Service: Cardiovascular;  Laterality: N/A;  . foot surgeries     bilaterally to remove bunions  . varicose vein "stripped"     pt wears compression stocking now    Family History  Problem Relation Age  of Onset  . Breast cancer Paternal Aunt 30  . Cancer Mother   . Cancer Father    Social History:  reports that she has never smoked. She has never used smokeless tobacco. She reports that she does not drink alcohol or use drugs.  Allergies:  Allergies  Allergen Reactions  . Ampicillin   . Codeine   . Mevacor [Lovastatin]   . Valium [Diazepam]     Medications:                                                                                                                           I have reviewed the patient's current medications. Prior to Admission: ASA, Plavix, Atorvastatin, Calcium- Vitamin D, Levothyroxine, losartan, metoprolol nitroglycerin prn Continuous: . propofol (DIPRIVAN) infusion     [id number], succinylcholine  ROS:  History obtained from chart review  ROS negative except as per HPI.   Neurologic Examination:                                                                                                      Blood pressure 173/77, pulse 65, resp. rate 16, SpO2 100 %.  Vitals:   05-02-2016 1010 05-02-16 1011 2016/05/02 1012 May 02, 2016 1015  BP:   186/83 173/77  Pulse: (!) 51 (!) 53 72 65  Resp: 16 16 22 16   SpO2: 100% 100% 100% 100%   General: Vital signs reviewed.  Patient is elderly, frail, in no acute distress and cooperative with exam.  Head: Normocephalic and atraumatic. Eyes: Pupils minimally reactive, conjunctivae normal Neck: Supple, trachea midline  Cardiovascular: Bradycardic, regular rhythm Pulmonary/Chest: Clear to anterior auscultation bilaterally, shallow breathing, decreased respirations Abdominal: Soft, non-distended, BS + Extremities: No lower extremity edema bilaterally,  pulses symmetric and intact bilaterally.  Skin: Scattered ecchymoses Psychiatric: Unable to assess.   Neurological Examination  Mental  Status: Patient does not respond to verbal stimuli.  Does not respond to deep sternal rub.  Does not follow commands.  No verbalizations noted.  Cranial Nerves: II: patient does not respond confrontation bilaterally, pupils bilaterally minimally responsive III,IV,VI: doll's response absent bilaterally.  V,VII: corneal reflex absent bilaterally  VIII: patient does not respond to verbal stimuli IX,X: gag reflex absent XI: trapezius strength unable to test bilaterally XII: tongue strength unable to test Motor: Some spontaneous movement noted.  No purposeful movements noted. Sensory: Withdraws to pain in bilateral lower extremities Plantars: upgoing bilaterally Cerebellar: Unable to perform  Lab Results: Basic Metabolic Panel:  Recent Labs Lab 05-02-16 0955 2016/05/02 1002  NA 138 138  K 3.1* 3.2*  CL 103 103  CO2 23  --   GLUCOSE 193* 195*  BUN 24* 25*  CREATININE 1.20* 1.20*  CALCIUM 9.1  --    CBC:  Recent Labs Lab 02-May-2016 0955 2016-05-02 1002  WBC 5.6  --   NEUTROABS 3.2  --   HGB 11.8* 11.9*  HCT 34.8* 35.0*  MCV 91.8  --   PLT 164  --     CBG:  Recent Labs Lab May 02, 2016 1000  GLUCAP 198*    Imaging: Dg Chest Portable 1 View  Result Date: 05/02/16 CLINICAL DATA:  Endotracheal tube placement. EXAM: PORTABLE CHEST 1 VIEW COMPARISON:  Radiograph of March 19, 2016. FINDINGS: Stable cardiomediastinal silhouette. Atherosclerosis of thoracic aorta is noted. Interval placement of endotracheal tube with distal tip 5 cm above the carina. No pneumothorax or pleural effusion is noted. Bony thorax is unremarkable. IMPRESSION: Endotracheal tube in grossly good position. No acute cardiopulmonary abnormality seen. Aortic atherosclerosis. Electronically Signed   By: Lupita Raider, M.D.   On: 05/02/2016 10:26   Ct Head Code Stroke W/o Cm  Result Date: May 02, 2016 CLINICAL DATA:  Code stroke. 82 year old female with headache. Initial encounter. EXAM: CT HEAD WITHOUT  CONTRAST TECHNIQUE: Contiguous axial images were obtained from the base of the skull through the vertex without intravenous contrast. COMPARISON:  None. FINDINGS: Brain:  Relatively large volume of subarachnoid hemorrhage throughout the basilar cisterns. Fairly symmetric distribution. Intraventricular hemorrhage in the third and fourth ventricles mostly, small volume of lateral intraventricular hemorrhage near the foramina of [place]. There might also be a small volume of intraventricular hemorrhage in the right occipital horn. Ventriculomegaly with evidence of transependymal edema about the atria and occipital horns. No superimposed acute parenchymal hemorrhage or acute cortically based infarct identified. No midline shift. There is an area of midline CSF preserved about the cerebellar vermis. This might be related to mega cisterna magna variant or a small arachnoid cyst. Vascular: Calcified atherosclerosis at the skull base. Major vascular structures otherwise obscured by large volume subarachnoid hemorrhage. Skull: No acute or suspicious bone lesion identified. No skull fracture identified. Sinuses/Orbits: Clear. Other: Disc conjugate gaze but otherwise negative orbit soft tissues. No scalp hematoma or acute appearing scalp soft tissue finding. ASPECTS [facility]([place] Stroke Program Early CT Score) Total score (0-10 with 10 being normal): Not applicable, intracranial hemorrhage. IMPRESSION: 1. Relatively large volume subarachnoid hemorrhage throughout the bilateral basilar cisterns. The pattern is indicative of acute rupture of an intracranial aneurysm. 2. Moderate volume intraventricular hemorrhage with evidence of acute ventriculomegaly. 3. ASPECTS is not applicable; acute intracranial hemorrhage. 4. The above was relayed via text pager to Dr. Ritta SlotMcNeill Kirkpatrick on 04/30/2016 at 10:40 . Electronically Signed   By: Odessa FlemingH  Hall M.D.   On: 06/26/15 10:41   Assessment and plan discussed with with attending physician and they  are in agreement.    Stroke Risk Factors - diabetes mellitus, hyperlipidemia and hypertension    Assessment: 82 y.o. female with PMHx of HTN, HLD, Hypothyroidism, Chronic HFrEF and recent NSTEMI in November 2017 on DAPT with ASA and Plavix who developed severe posterior headache, went unresponsive, and was found to have a subarachnoid hemorrhage.   Subarachnoid Hemorrhage: Patient presented with headache and then became unresponsive. CT Head wo contrast showed large volume subarachnoid hemorrhage throughout the bilateral basilar cisterns likely secondary to acute rupture of intracranial aneurysm with moderate volume intraventricular hemorrhage and acute ventriculomegaly.  -CT Angio Head and Neck -NPO, Intubated on Propofol -Neuro checks Q2H -Cardiac monitoring -Blood pressure control for SBP <160 -Monitor for arrhythmias and seizure -Neurosurgery to see for admission  The above was discussed with Dr. Amada JupiterKirkpatrick, MD, who will leave his formal recommendations.   Karlene LinemanAlexa Brayli Klingbeil, DO PGY-3 Internal Medicine Resident Pager # [phone] 05/02/2016 10:58 AM

## 2016-04-13 ENCOUNTER — Inpatient Hospital Stay (HOSPITAL_COMMUNITY): Payer: Medicare Other

## 2016-04-13 DIAGNOSIS — R4189 Other symptoms and signs involving cognitive functions and awareness: Secondary | ICD-10-CM

## 2016-04-13 LAB — GLUCOSE, CAPILLARY
GLUCOSE-CAPILLARY: 114 mg/dL — AB (ref 65–99)
GLUCOSE-CAPILLARY: 117 mg/dL — AB (ref 65–99)
Glucose-Capillary: 137 mg/dL — ABNORMAL HIGH (ref 65–99)
Glucose-Capillary: 121 mg/dL — ABNORMAL HIGH (ref 65–99)
Glucose-Capillary: 102 mg/dL — ABNORMAL HIGH (ref 65–99)
Glucose-Capillary: 110 mg/dL — ABNORMAL HIGH (ref 65–99)

## 2016-04-13 LAB — BASIC METABOLIC PANEL
Anion gap: 9 (ref 5–15)
BUN: 15 mg/dL (ref 6–20)
CO2: 23 mmol/L (ref 22–32)
CREATININE: 0.95 mg/dL (ref 0.44–1.00)
Calcium: 8.1 mg/dL — ABNORMAL LOW (ref 8.9–10.3)
Chloride: 108 mmol/L (ref 101–111)
GFR, EST NON AFRICAN AMERICAN: 54 mL/min — AB (ref >60)
Glucose, Bld: 142 mg/dL — ABNORMAL HIGH (ref 65–99)
Potassium: 4.2 mmol/L (ref 3.5–5.1)
SODIUM: 140 mmol/L (ref 135–145)

## 2016-04-13 LAB — CBC
HCT: 31.6 % — ABNORMAL LOW (ref 36.0–46.0)
Hemoglobin: 10.7 g/dL — ABNORMAL LOW (ref 12.0–15.0)
MCH: 31.3 pg (ref 26.0–34.0)
MCHC: 33.9 g/dL (ref 30.0–36.0)
MCV: 92.4 fL (ref 78.0–100.0)
PLATELETS: 168 10*3/uL (ref 150–400)
RBC: 3.42 MIL/uL — AB (ref 3.87–5.11)
RDW: 15.6 % — AB (ref 11.5–15.5)
WBC: 10.1 10*3/uL (ref 4.0–10.5)

## 2016-04-13 LAB — MAGNESIUM: MAGNESIUM: 1.7 mg/dL (ref 1.7–2.4)

## 2016-04-13 LAB — TROPONIN I: TROPONIN I: 0.14 ng/mL — AB (ref <0.03)

## 2016-04-13 LAB — PHOSPHORUS: PHOSPHORUS: 2.6 mg/dL (ref 2.5–4.6)

## 2016-04-13 MED ORDER — MAGNESIUM SULFATE 2 GM/50ML IV SOLN
2.0000 g | Freq: Once | INTRAVENOUS | Status: AC
  Administered 2016-04-13: 2 g via INTRAVENOUS
  Filled 2016-04-13: qty 50

## 2016-04-13 NOTE — Progress Notes (Signed)
PULMONARY / CRITICAL CARE MEDICINE   Name: Alyssa Christensen MRN: [id number] DOB: 1930/11/23    ADMISSION DATE:  04/24/2016 CONSULTATION DATE:  12/8  REFERRING MD:  Lovell SheehanJenkins  CHIEF COMPLAINT:  SAH  HISTORY OF PRESENT ILLNESS:   This is a 82 year old female w/ PMH of CAD (had recent NSTEMI 2017) and hypothyroidism. Was in usual state of health until am of 12/8 started having some chest discomfort. Called a friend. On way to ER developed "severe head ache". They pulled over. Called EMS. In less than 5 minutes she was minimally responsive. She arrived in the ER w/ GCS about 5. Was intubated for airway protection and CT head was obtained. This demonstrated severe SAH w/ Intraventricular extension as well as obstructive hydrocephalus. She was seen by neuro-surg who placed a IVC and then PCCM asked to admit.   SUBJECTIVE:  Sedated on vent  Ventriculostomy tube placed yesterday by NS  , draining well this am   VITAL SIGNS: BP (!) 131/51   Pulse 85   Temp 99.9 F (37.7 C)   Resp (!) 21   Ht 5\' 7"  (1.702 m)   Wt 68.5 kg (151 lb 0.2 oz)   SpO2 100%   BMI 23.65 kg/m   HEMODYNAMICS:    VENTILATOR SETTINGS: Vent Mode: PRVC FiO2 (%):  [30 %-40 %] 30 % Set Rate:  [16 bmp] 16 bmp Vt Set:  [400 mL] 400 mL PEEP:  [5 cmH20] 5 cmH20 Plateau Pressure:  [11 cmH20-15 cmH20] 11 cmH20  INTAKE / OUTPUT: I/O last 3 completed shifts: In: 2928.3 [I.V.:1663.3; IV Piggyback:1265] Out: 1861 [Urine:840; Emesis/NG output:925; Drains:96]  PHYSICAL EXAMINATION: General:  Elderly 82 year old sedated on vent Neuro:  Sedated. Pupils equal, remains unresponisive /Sedated  HEENT:  Orally intubated. MMM Cardiovascular:  RRR w/out MRG Lungs:  Scattered rhonchi. Equal chest rise  Abdomen:  Soft, not tender + bowel sounds Musculoskeletal:  Equal bulk Skin:  Warm and dry. Scattered areas of ecchymosis.   LABS:  BMET  Recent Labs Lab 03-24-2016 0955 03-24-2016 1002 04/13/16 0306  NA 138 138 140  K 3.1* 3.2*  4.2  CL 103 103 108  CO2 23  --  23  BUN 24* 25* 15  CREATININE 1.20* 1.20* 0.95  GLUCOSE 193* 195* 142*    Electrolytes  Recent Labs Lab 03-24-2016 0955 03-24-2016 1656 04/13/16 0306  CALCIUM 9.1  --  8.1*  MG  --  1.9 1.7  PHOS  --  3.1 2.6    CBC  Recent Labs Lab 03-24-2016 0955 03-24-2016 1002 03-24-2016 1656 04/13/16 0306  WBC 5.6  --  11.4* 10.1  HGB 11.8* 11.9* 11.3* 10.7*  HCT 34.8* 35.0* 33.8* 31.6*  PLT 164  --  157 168    Coag's  Recent Labs Lab 03-24-2016 0955 03-24-2016 1656  APTT 25 27  INR 0.99 1.11    Sepsis Markers No results for input(s): LATICACIDVEN, PROCALCITON, O2SATVEN in the last 168 hours.  ABG  Recent Labs Lab 03-24-2016 1128  PHART 7.412  PCO2ART 40.7  PO2ART 158.0*    Liver Enzymes  Recent Labs Lab 03-24-2016 0955  AST 32  ALT 21  ALKPHOS 55  BILITOT 1.1  ALBUMIN 3.7    Cardiac Enzymes  Recent Labs Lab 03-24-2016 1656 03-24-2016 2130 04/13/16 0306  TROPONINI 0.19* 0.17* 0.14*    Glucose  Recent Labs Lab 03-24-2016 1000 03-24-2016 1738 03-24-2016 1946 03-24-2016 2308 04/13/16 0433  GLUCAP 198* 165* 127* 94 137*  Imaging Ct Angio Head W Or Wo Contrast  Result Date: Apr 29, 2016 CLINICAL DATA:  82 year old female with acute subarachnoid hemorrhage appearing aneurysmal related. Code stroke presentation. Initial encounter. EXAM: CT ANGIOGRAPHY HEAD TECHNIQUE: Multidetector CT imaging of the head was performed using the standard protocol during bolus administration of intravenous contrast. Multiplanar CT image reconstructions and MIPs were obtained to evaluate the vascular anatomy. CONTRAST:  50 mL Isovue 370 COMPARISON:  None. FINDINGS: CTA HEAD Posterior circulation: Patent but diminutive distal vertebral arteries and basilar artery. Fetal type left PCA origin. Normal right PCA origin. Right posterior communicating artery diminutive or absent. Right PICA, left AICA, and both SCA origins are patent. Anterior circulation: Patent in  normal distal cervical ICAs. Both ICA siphons are patent. Calcified siphon atherosclerosis. No hemodynamically significant stenosis on either side. The left posterior communicating artery origin appears normal. Both ophthalmic artery origin regions appear normal. Both carotid termini appear normal. MCA and ACA origins are normal. Patent but diminutive A1 segments and visualized bilateral ACA branches. The anterior communicating artery appears diminutive and normal (series 7, image 98). Patent but somewhat diminutive bilateral MCA branches. Asymmetric early right MCA branching (series 11, image 17). Both M1 segments and MCA bifurcations otherwise appear within normal limits. No abnormal tangle of vessels identified anywhere to suggest AVM. Venous sinuses: Appear patent on the delayed phase image. Anatomic variants: Fetal type left PCA origin. Delayed phase: Large volume subarachnoid hemorrhage. Intraventricular hemorrhage with ventriculomegaly re- demonstrated. No abnormal enhancement identified. IMPRESSION: 1. No intracranial aneurysm identified on head CTA, but I suspect rupture from occult aneurysm as the source of the subarachnoid hemorrhage. This was discussed by telephone with Dr. Ritta Slot on 04/29/2016 at 10:57 . 2. Diffusely diminutive appearance of the anterior and posterior circulation likely due to subarachnoid hemorrhage related vasospasm. 3. Calcified ICA siphon atherosclerosis without stenosis. 4. Stable large volume subarachnoid hemorrhage, associated intraventricular hemorrhage, and ventriculomegaly. Electronically Signed   By: Odessa Fleming M.D.   On: 29-Apr-2016 10:59   Portable Chest Xray  Result Date: 04/13/2016 CLINICAL DATA:  Subarachnoid hemorrhage EXAM: PORTABLE CHEST 1 VIEW COMPARISON:  04/29/2016 FINDINGS: Endotracheal tube in good position. NG tube has been placed with the tip in the stomach. Mild bibasilar atelectasis.  Negative for edema or effusion. IMPRESSION: Endotracheal tube  in good position.  NG tube in the stomach Mild bibasilar atelectasis. Electronically Signed   By: Marlan Palau M.D.   On: 04/13/2016 06:57   Dg Chest Portable 1 View  Result Date: April 29, 2016 CLINICAL DATA:  Endotracheal tube placement. EXAM: PORTABLE CHEST 1 VIEW COMPARISON:  Radiograph of March 19, 2016. FINDINGS: Stable cardiomediastinal silhouette. Atherosclerosis of thoracic aorta is noted. Interval placement of endotracheal tube with distal tip 5 cm above the carina. No pneumothorax or pleural effusion is noted. Bony thorax is unremarkable. IMPRESSION: Endotracheal tube in grossly good position. No acute cardiopulmonary abnormality seen. Aortic atherosclerosis. Electronically Signed   By: Lupita Raider, M.D.   On: 2016-04-29 10:26   Ct Head Code Stroke W/o Cm  Result Date: 04-29-16 CLINICAL DATA:  Code stroke. 82 year old female with headache. Initial encounter. EXAM: CT HEAD WITHOUT CONTRAST TECHNIQUE: Contiguous axial images were obtained from the base of the skull through the vertex without intravenous contrast. COMPARISON:  None. FINDINGS: Brain: Relatively large volume of subarachnoid hemorrhage throughout the basilar cisterns. Fairly symmetric distribution. Intraventricular hemorrhage in the third and fourth ventricles mostly, small volume of lateral intraventricular hemorrhage near the foramina of [place]. There might also be a  small volume of intraventricular hemorrhage in the right occipital horn. Ventriculomegaly with evidence of transependymal edema about the atria and occipital horns. No superimposed acute parenchymal hemorrhage or acute cortically based infarct identified. No midline shift. There is an area of midline CSF preserved about the cerebellar vermis. This might be related to mega cisterna magna variant or a small arachnoid cyst. Vascular: Calcified atherosclerosis at the skull base. Major vascular structures otherwise obscured by large volume subarachnoid hemorrhage. Skull:  No acute or suspicious bone lesion identified. No skull fracture identified. Sinuses/Orbits: Clear. Other: Disc conjugate gaze but otherwise negative orbit soft tissues. No scalp hematoma or acute appearing scalp soft tissue finding. ASPECTS [facility]([place] Stroke Program Early CT Score) Total score (0-10 with 10 being normal): Not applicable, intracranial hemorrhage. IMPRESSION: 1. Relatively large volume subarachnoid hemorrhage throughout the bilateral basilar cisterns. The pattern is indicative of acute rupture of an intracranial aneurysm. 2. Moderate volume intraventricular hemorrhage with evidence of acute ventriculomegaly. 3. ASPECTS is not applicable; acute intracranial hemorrhage. 4. The above was relayed via text pager to Dr. Ritta SlotMcNeill Kirkpatrick on 04/11/2016 at 10:40 . Electronically Signed   By: Odessa FlemingH  Hall M.D.   On: 04/09/2016 10:41     STUDIES:  Ct head 12/8: 1. Relatively large volume subarachnoid hemorrhage throughout the bilateral basilar cisterns. The pattern is indicative of acute rupture of an intracranial aneurysm. 2. Moderate volume intraventricular hemorrhage with evidence of acute ventriculomegaly. CT angio 12/8: 1. No intracranial aneurysm identified on head CTA, but suspect rupture from occult aneurysm as the source of the subarachnoid. 2. Diffusely diminutive appearance of the anterior and posterior circulation likely due to subarachnoid hemorrhage related vasospasm. 3. Calcified ICA siphon atherosclerosis without stenosis. 4. Stable large volume subarachnoid hemorrhage, associated intraventricular hemorrhage, and ventriculomegaly.  CULTURES:   ANTIBIOTICS:   SIGNIFICANT EVENTS: Ventriculostomy tube placed 12/8    LINES/TUBES: oett 12/8>>> IVC 12/8>>>  DISCUSSION: [age] yof w/ cad and nstemi in Nov 2017. Admitted w/ H&H grade 5 SAH, IVH and obstructive hydrocephalus. Intubated for airway protection. Now has IVC. Will admit to Neuro ICU. SAH protocol order set initiated.    ASSESSMENT / PLAN:  NEUROLOGIC A:   COMA SAH H&H score 5; w/ IVH and obstructive hydrocephalus s/p IVC cath per neuro P:   RASS goal: -1 to -2  Propofol and PRN fent Avoid SBP >160  PULMONARY A: Ventilator dependence s/p SAH P:   Full vent support PAD and VAP protocol  F/u cxr and abg in am Avoid hyperventilation   CARDIOVASCULAR A:  CAD w/ recent NSTEMI in Nov 2017 Most recent ECHO 50% P:  Monitor   RENAL A:   AKI Hypokalemia -resolved  P:   Gentle hydration Strict I&O Renal dose meds   GASTROINTESTINAL A:   No acute P:   H2B for SUP npo  HEMATOLOGIC A:   Anemia  P:  PAS to LEs No heparin    INFECTIOUS A:   No acute  P:   Trend fever curve   ENDOCRINE A:   Hyperglycemia   Hypothyroidism  P:   ssi  Cont synthroid    FAMILY  - Updates:   - Inter-disciplinary family meet or Palliative Care meeting due by: 12/15  Tammy Parrett NP-C  [facility]  [phone]   04/13/2016

## 2016-04-13 NOTE — Plan of Care (Signed)
Problem: Education: Goal: Knowledge of disease or condition will improve Outcome: Completed/Met Date Met: 04/13/16 Discussed with family the complications associated with SAH. Dr. Arnoldo Morale spoke with family regarding patient prognosis.

## 2016-04-13 NOTE — Progress Notes (Signed)
Patient ID: Alyssa Christensen, female   DOB: Apr 14, 1931, 82 y.o.   MRN: [id number] Subjective:  The patient is intubated and in no apparent distress.  Objective: Vital signs in last 24 hours: Temp:  [95.2 F (35.1 C)-100.8 F (38.2 C)] 99.9 F (37.7 C) (12/09 0743) Pulse Rate:  [46-106] 85 (12/09 0743) Resp:  [5-24] 21 (12/09 0743) BP: (108-211)/(38-135) 131/51 (12/09 0743) SpO2:  [98 %-100 %] 100 % (12/09 0743) FiO2 (%):  [30 %-40 %] 30 % (12/09 0743) Weight:  [68.5 kg (151 lb 0.2 oz)] 68.5 kg (151 lb 0.2 oz) (12/08 1700)  Intake/Output from previous day: 12/08 0701 - 12/09 0700 In: 2928.3 [I.V.:1663.3; IV Piggyback:1265] Out: 1861 [Urine:840; Emesis/NG output:925; Drains:96] Intake/Output this shift: No intake/output data recorded.  Physical exam Glasgow Coma Scale 6 intubated, [id number]. She abnormally flexes bilaterally to pain. Her pupils are equal.  The patient's ventriculostomy is patent and draining.  Lab Results:  Recent Labs  08/16/2015 1656 04/13/16 0306  WBC 11.4* 10.1  HGB 11.3* 10.7*  HCT 33.8* 31.6*  PLT 157 168   BMET  Recent Labs  08/16/2015 0955 08/16/2015 1002 04/13/16 0306  NA 138 138 140  K 3.1* 3.2* 4.2  CL 103 103 108  CO2 23  --  23  GLUCOSE 193* 195* 142*  BUN 24* 25* 15  CREATININE 1.20* 1.20* 0.95  CALCIUM 9.1  --  8.1*    Studies/Results: Ct Angio Head W Or Wo Contrast  Result Date: 04/18/2016 CLINICAL DATA:  82 year old female with acute subarachnoid hemorrhage appearing aneurysmal related. Code stroke presentation. Initial encounter. EXAM: CT ANGIOGRAPHY HEAD TECHNIQUE: Multidetector CT imaging of the head was performed using the standard protocol during bolus administration of intravenous contrast. Multiplanar CT image reconstructions and MIPs were obtained to evaluate the vascular anatomy. CONTRAST:  50 mL Isovue 370 COMPARISON:  None. FINDINGS: CTA HEAD Posterior circulation: Patent but diminutive distal vertebral arteries and basilar  artery. Fetal type left PCA origin. Normal right PCA origin. Right posterior communicating artery diminutive or absent. Right PICA, left AICA, and both SCA origins are patent. Anterior circulation: Patent in normal distal cervical ICAs. Both ICA siphons are patent. Calcified siphon atherosclerosis. No hemodynamically significant stenosis on either side. The left posterior communicating artery origin appears normal. Both ophthalmic artery origin regions appear normal. Both carotid termini appear normal. MCA and ACA origins are normal. Patent but diminutive A1 segments and visualized bilateral ACA branches. The anterior communicating artery appears diminutive and normal (series 7, image 98). Patent but somewhat diminutive bilateral MCA branches. Asymmetric early right MCA branching (series 11, image 17). Both M1 segments and MCA bifurcations otherwise appear within normal limits. No abnormal tangle of vessels identified anywhere to suggest AVM. Venous sinuses: Appear patent on the delayed phase image. Anatomic variants: Fetal type left PCA origin. Delayed phase: Large volume subarachnoid hemorrhage. Intraventricular hemorrhage with ventriculomegaly re- demonstrated. No abnormal enhancement identified. IMPRESSION: 1. No intracranial aneurysm identified on head CTA, but I suspect rupture from occult aneurysm as the source of the subarachnoid hemorrhage. This was discussed by telephone with Dr. Ritta SlotMcNeill Kirkpatrick on 04/11/2016 at 10:57 . 2. Diffusely diminutive appearance of the anterior and posterior circulation likely due to subarachnoid hemorrhage related vasospasm. 3. Calcified ICA siphon atherosclerosis without stenosis. 4. Stable large volume subarachnoid hemorrhage, associated intraventricular hemorrhage, and ventriculomegaly. Electronically Signed   By: Odessa FlemingH  Hall M.D.   On: 04/18/16 10:59   Portable Chest Xray  Result Date: 04/13/2016 CLINICAL DATA:  Subarachnoid hemorrhage  EXAM: PORTABLE CHEST 1 VIEW  COMPARISON:  09-21-2015 FINDINGS: Endotracheal tube in good position. NG tube has been placed with the tip in the stomach. Mild bibasilar atelectasis.  Negative for edema or effusion. IMPRESSION: Endotracheal tube in good position.  NG tube in the stomach Mild bibasilar atelectasis. Electronically Signed   By: Marlan Palauharles  Clark M.D.   On: 04/13/2016 06:57   Dg Chest Portable 1 View  Result Date: 05/01/2016 CLINICAL DATA:  Endotracheal tube placement. EXAM: PORTABLE CHEST 1 VIEW COMPARISON:  Radiograph of March 19, 2016. FINDINGS: Stable cardiomediastinal silhouette. Atherosclerosis of thoracic aorta is noted. Interval placement of endotracheal tube with distal tip 5 cm above the carina. No pneumothorax or pleural effusion is noted. Bony thorax is unremarkable. IMPRESSION: Endotracheal tube in grossly good position. No acute cardiopulmonary abnormality seen. Aortic atherosclerosis. Electronically Signed   By: Lupita RaiderJames  Green Jr, M.D.   On: 09-21-2015 10:26   Ct Head Code Stroke W/o Cm  Result Date: 04/17/2016 CLINICAL DATA:  Code stroke. 82 year old female with headache. Initial encounter. EXAM: CT HEAD WITHOUT CONTRAST TECHNIQUE: Contiguous axial images were obtained from the base of the skull through the vertex without intravenous contrast. COMPARISON:  None. FINDINGS: Brain: Relatively large volume of subarachnoid hemorrhage throughout the basilar cisterns. Fairly symmetric distribution. Intraventricular hemorrhage in the third and fourth ventricles mostly, small volume of lateral intraventricular hemorrhage near the foramina of [place]. There might also be a small volume of intraventricular hemorrhage in the right occipital horn. Ventriculomegaly with evidence of transependymal edema about the atria and occipital horns. No superimposed acute parenchymal hemorrhage or acute cortically based infarct identified. No midline shift. There is an area of midline CSF preserved about the cerebellar vermis. This might be  related to mega cisterna magna variant or a small arachnoid cyst. Vascular: Calcified atherosclerosis at the skull base. Major vascular structures otherwise obscured by large volume subarachnoid hemorrhage. Skull: No acute or suspicious bone lesion identified. No skull fracture identified. Sinuses/Orbits: Clear. Other: Disc conjugate gaze but otherwise negative orbit soft tissues. No scalp hematoma or acute appearing scalp soft tissue finding. ASPECTS [facility]([place] Stroke Program Early CT Score) Total score (0-10 with 10 being normal): Not applicable, intracranial hemorrhage. IMPRESSION: 1. Relatively large volume subarachnoid hemorrhage throughout the bilateral basilar cisterns. The pattern is indicative of acute rupture of an intracranial aneurysm. 2. Moderate volume intraventricular hemorrhage with evidence of acute ventriculomegaly. 3. ASPECTS is not applicable; acute intracranial hemorrhage. 4. The above was relayed via text pager to Dr. Ritta SlotMcNeill Kirkpatrick on 04/20/2016 at 10:40 . Electronically Signed   By: Odessa FlemingH  Hall M.D.   On: 09-21-2015 10:41    Assessment/Plan: Subarachnoid hemorrhage, intraventricular hemorrhage and hydrocephalus: The patient's ventriculostomy is draining well. Her clinical status has not changed. We are awaiting family members to arrive to determine the level of aggressiveness. We will continue with supportive care for now. I appreciate critical care medicine's management of her medical issues.  LOS: 1 day     Alyssa Christensen D 04/13/2016, 9:50 AM

## 2016-04-13 NOTE — Progress Notes (Signed)
Initial Nutrition Assessment  DOCUMENTATION CODES:  Not applicable  INTERVENTION:  If medically able and in line with direction of care, would Initiate TF via OGT with Vital AF 1.2 at goal rate of 55 ml/h (1320 ml per day) to provide 1584 kcals,99 gm protein, 1071 ml free water daily.  NUTRITION DIAGNOSIS:  Inadequate oral intake related to inability to eat as evidenced by NPO status.  GOAL:  Patient will meet greater than or equal to 90% of their needs  MONITOR:  Vent status, Diet advancement, Labs, I & O's, Goals of care  REASON FOR ASSESSMENT:  Ventilator    ASSESSMENT:  82 y/o female PMHx Depression, HTN, HLD, CAD (NSTEMI 2017), hypothyroidism. Presented via EMS after acute onset of severe headache. Became unresponsive and intubate in ED. CT demonstrated severe SAH and obstructive hydrocephalus sp IVC. Poor prognosis for meaningful recovery per MD  Pt intubated, no historians present.   Abdomen soft, non distended  Physical Exam reveals mild-mod generalized edema. Has severe temporal wasting, however suspected to just be how patient presents as weight is stable and displays no other signs of wasting.   Per RN, family meeting to occur at 1 pm. No decisions at this time.   Patient is currently intubated on ventilator support MV: 8.2 L/min Temp (24hrs), Avg:100.2 F (37.9 C), Min:95.2 F (35.1 C), Max:100.8 F (38.2 C)  Propofol: None  Per chart review and Care Everywhere. Pts weight appears relatively stable. Was 149 lbs ~15 months ago, 140 early this year, and now has regained to ~150.   Labs: BG 115-145 mg/dl, TG 52. Last [id number] 01/6010/14: 5.9 Medications:colace, pepcid, insulin, NS   Recent Labs Lab 04/07/2016 0955 04/07/2016 1002 04/07/2016 1656 04/13/16 0306  NA 138 138  --  140  K 3.1* 3.2*  --  4.2  CL 103 103  --  108  CO2 23  --   --  23  BUN 24* 25*  --  15  CREATININE 1.20* 1.20*  --  0.95  CALCIUM 9.1  --   --  8.1*  MG  --   --  1.9 1.7  PHOS  --   --   3.1 2.6  GLUCOSE 193* 195*  --  142*   Diet Order:  Diet NPO time specified  Skin: Surgical incision to head/IVC  Last BM:  Unknown  Height:  Ht Readings from Last 1 Encounters:  04/07/2016 5\' 7"  (1.702 m)   Weight:  Wt Readings from Last 1 Encounters:  04/07/2016 151 lb 0.2 oz (68.5 kg)   Wt Readings from Last 10 Encounters:  04/07/2016 151 lb 0.2 oz (68.5 kg)  04/02/16 151 lb 4 oz (68.6 kg)  03/19/16 146 lb 11.2 oz (66.5 kg)   Ideal Body Weight:  61.36 kg  BMI:  Body mass index is 23.65 kg/m.  Estimated Nutritional Needs:  Kcal:  1550 kcals Protein:  90-105 g (1.3-1.5 g/kg bw) Fluid:  Per MD  EDUCATION NEEDS:  No education needs identified at this time  Christophe LouisNathan Franks RD, LDN, CNSC Clinical Nutrition Pager: [phone] 04/13/2016 9:59 AM

## 2016-04-14 ENCOUNTER — Other Ambulatory Visit (HOSPITAL_COMMUNITY): Payer: BLUE CROSS/BLUE SHIELD

## 2016-04-14 DIAGNOSIS — G911 Obstructive hydrocephalus: Secondary | ICD-10-CM

## 2016-04-14 DIAGNOSIS — G9340 Encephalopathy, unspecified: Secondary | ICD-10-CM

## 2016-04-14 LAB — BASIC METABOLIC PANEL
ANION GAP: 9 (ref 5–15)
BUN: 11 mg/dL (ref 6–20)
CHLORIDE: 110 mmol/L (ref 101–111)
CO2: 21 mmol/L — AB (ref 22–32)
Calcium: 8 mg/dL — ABNORMAL LOW (ref 8.9–10.3)
Creatinine, Ser: 0.97 mg/dL (ref 0.44–1.00)
GFR calc Af Amer: >60 mL/min (ref >60)
GFR calc non Af Amer: 53 mL/min — ABNORMAL LOW (ref >60)
GLUCOSE: 121 mg/dL — AB (ref 65–99)
POTASSIUM: 3.4 mmol/L — AB (ref 3.5–5.1)
Sodium: 140 mmol/L (ref 135–145)

## 2016-04-14 LAB — PHOSPHORUS: PHOSPHORUS: 2.6 mg/dL (ref 2.5–4.6)

## 2016-04-14 LAB — GLUCOSE, CAPILLARY
GLUCOSE-CAPILLARY: 130 mg/dL — AB (ref 65–99)
GLUCOSE-CAPILLARY: 163 mg/dL — AB (ref 65–99)
Glucose-Capillary: 124 mg/dL — ABNORMAL HIGH (ref 65–99)

## 2016-04-14 LAB — CBC
HEMATOCRIT: 30.6 % — AB (ref 36.0–46.0)
HEMOGLOBIN: 10.3 g/dL — AB (ref 12.0–15.0)
MCH: 31 pg (ref 26.0–34.0)
MCHC: 33.7 g/dL (ref 30.0–36.0)
MCV: 92.2 fL (ref 78.0–100.0)
Platelets: 137 10*3/uL — ABNORMAL LOW (ref 150–400)
RBC: 3.32 MIL/uL — ABNORMAL LOW (ref 3.87–5.11)
RDW: 15.7 % — AB (ref 11.5–15.5)
WBC: 10.9 10*3/uL — ABNORMAL HIGH (ref 4.0–10.5)

## 2016-04-14 LAB — MAGNESIUM: Magnesium: 2.4 mg/dL (ref 1.7–2.4)

## 2016-04-14 MED ORDER — ATROPINE SULFATE 1 % OP SOLN
2.0000 [drp] | Freq: Four times a day (QID) | OPHTHALMIC | Status: DC | PRN
  Filled 2016-04-14: qty 2

## 2016-04-14 MED ORDER — MORPHINE SULFATE 25 MG/ML IV SOLN
1.0000 mg/h | INTRAVENOUS | Status: DC
  Administered 2016-04-14 – 2016-04-15 (×2): 10 mg/h via INTRAVENOUS
  Filled 2016-04-14 (×2): qty 10

## 2016-04-14 MED ORDER — NIMODIPINE 30 MG PO CAPS: 60.0000 mg | ORAL_CAPSULE | ORAL | Status: DC

## 2016-04-14 MED ORDER — NIMODIPINE 60 MG/20ML PO SOLN
60.0000 mg | ORAL | Status: DC
  Administered 2016-04-14: 09:00:00 60 mg
  Filled 2016-04-14 (×2): qty 20

## 2016-04-14 MED ORDER — MORPHINE BOLUS VIA INFUSION
5.0000 mg | INTRAVENOUS | Status: DC | PRN
  Filled 2016-04-14: qty 20

## 2016-04-14 MED ORDER — LABETALOL HCL 5 MG/ML IV SOLN
10.0000 mg | INTRAVENOUS | Status: DC | PRN
  Administered 2016-04-14: 10 mg via INTRAVENOUS
  Filled 2016-04-14: qty 4

## 2016-04-14 NOTE — Progress Notes (Addendum)
PULMONARY / CRITICAL CARE MEDICINE   Name: Chuck HintSally I Langdon MRN: [id number] DOB: 04-02-1931    ADMISSION DATE:  04/07/2016 CONSULTATION DATE:  12/8  REFERRING MD:  Lovell SheehanJenkins  CHIEF COMPLAINT:  SAH  HISTORY OF PRESENT ILLNESS:   This is a 82 year old female w/ PMH of CAD (had recent NSTEMI 2017) and hypothyroidism. Was in usual state of health until am of 12/8 started having some chest discomfort. Called a friend. On way to ER developed "severe head ache". They pulled over. Called EMS. In less than 5 minutes she was minimally responsive. She arrived in the ER w/ GCS about 5. Was intubated for airway protection and CT head was obtained. This demonstrated severe SAH w/ Intraventricular extension as well as obstructive hydrocephalus. She was seen by neuro-surg who placed a IVC and then PCCM asked to admit.   SUBJECTIVE: No acute events overnight. Neurosurgery reports family leaning toward terminal extubation and comfort care today.   REVIEW OF SYSTEMS:  Unable to obtain with intubation & altered mentation.   VITAL SIGNS: BP (!) 163/69   Pulse 93   Temp 99 F (37.2 C) (Axillary)   Resp 18   Ht 5\' 6"  (1.676 m)   Wt 146 lb 13.2 oz (66.6 kg)   SpO2 99%   BMI 23.70 kg/m   HEMODYNAMICS:    VENTILATOR SETTINGS: Vent Mode: PRVC FiO2 (%):  [30 %] 30 % Set Rate:  [16 bmp] 16 bmp Vt Set:  [400 mL] 400 mL PEEP:  [5 cmH20] 5 cmH20 Pressure Support:  [10 cmH20] 10 cmH20 Plateau Pressure:  [13 cmH20-15 cmH20] 13 cmH20  INTAKE / OUTPUT: I/O last 3 completed shifts: In: 5400 [I.V.:3600; Other:650; IV Piggyback:1150] Out: 2297 [Emesis/NG output:2050; Drains:247]  PHYSICAL EXAMINATION: General:  No family at bedside. No distress. Integument:  Warm & dry. No rash on exposed skin.  HEENT:  No scleral injection or icterus. Endotracheal tube in place.  Cardiovascular:  Regular rate. No edema. No appreciable JVD.  Pulmonary:  Clear with auscultation bilaterally. Symmetric chest wall rise on  ventilator. Abdomen: Soft. Normal bowel sounds. Nondistended.  Neurological: Pupils symmetric. No spontaneous movements. Triple flexion and Babinski in bilateral lower extremities. No withdrawal to pain in extremities.  LABS:  BMET  Recent Labs Lab 02/18/2016 0955 02/18/2016 1002 04/13/16 0306 04/14/16 0648  NA 138 138 140 140  K 3.1* 3.2* 4.2 3.4*  CL 103 103 108 110  CO2 23  --  23 21*  BUN 24* 25* 15 11  CREATININE 1.20* 1.20* 0.95 0.97  GLUCOSE 193* 195* 142* 121*    Electrolytes  Recent Labs Lab 02/18/2016 0955 02/18/2016 1656 04/13/16 0306 04/14/16 0648  CALCIUM 9.1  --  8.1* 8.0*  MG  --  1.9 1.7 2.4  PHOS  --  3.1 2.6 2.6    CBC  Recent Labs Lab 02/18/2016 1656 04/13/16 0306 04/14/16 0648  WBC 11.4* 10.1 10.9*  HGB 11.3* 10.7* 10.3*  HCT 33.8* 31.6* 30.6*  PLT 157 168 137*    Coag's  Recent Labs Lab 02/18/2016 0955 02/18/2016 1656  APTT 25 27  INR 0.99 1.11    Sepsis Markers No results for input(s): LATICACIDVEN, PROCALCITON, O2SATVEN in the last 168 hours.  ABG  Recent Labs Lab 02/18/2016 1128  PHART 7.412  PCO2ART 40.7  PO2ART 158.0*    Liver Enzymes  Recent Labs Lab 02/18/2016 0955  AST 32  ALT 21  ALKPHOS 55  BILITOT 1.1  ALBUMIN 3.7  Cardiac Enzymes  Recent Labs Lab 04/09/2016 1656 04/10/2016 2130 04/13/16 0306  TROPONINI 0.19* 0.17* 0.14*    Glucose  Recent Labs Lab 04/13/16 1204 04/13/16 1534 04/13/16 1935 04/13/16 2325 04/14/16 0430 04/14/16 0748  GLUCAP 117* 121* 102* 110* 124* 130*    Imaging No results found.   STUDIES:  Ct head 12/8:  1. Relatively large volume subarachnoid hemorrhage throughout the bilateral basilar cisterns. The pattern is indicative of acute rupture of an intracranial aneurysm. 2. Moderate volume intraventricular hemorrhage with evidence of acute ventriculomegaly. CT angio 12/8:  1. No intracranial aneurysm identified on head CTA, but suspect rupture from occult aneurysm as the  source of the subarachnoid. 2. Diffusely diminutive appearance of the anterior and posterior circulation likely due to subarachnoid hemorrhage related vasospasm. 3. Calcified ICA siphon atherosclerosis without stenosis. 4. Stable large volume subarachnoid hemorrhage, associated intraventricular hemorrhage, and ventriculomegaly.  MICROBIOLOGY: MRSA PCR 12/8:  Negative   ANTIBIOTICS: None  SIGNIFICANT EVENTS: 12/8 - Admit 12/8 - Ventriculostomy tube placed      LINES/TUBES: OETT 7.5 12/8 >> Ventric Drain 12/8 >> OGT 12/8 >> Foley 12/8 >> PIV   ASSESSMENT / PLAN:  NEUROLOGIC A:   Acute Encephalopathy/Comatose SAH w/ IVH and obstructive Hydrocephalus - S/P Ventriculostomy Drain 12/8.  P:   RASS goal: -1 to -2  Propofol gtt Fentanyl IV prn Avoid SBP >160 Nimodipine  PULMONARY A: Acute Respiratory Failure - Unable to protect airway with SAH & obstructive hydrocephalus.  P:   Full vent support May perform terminal vent wean & extubation today depending on family wishes.  CARDIOVASCULAR A:  CAD - Recent NSTEMI in Nov 2017  P:  Continuous telemetry monitoring Vitals per unit protocol Goal MAP >65 Goal SBP <160 D/C aspirin PR  RENAL A:   Acute Renal Failure - Resolved. Hypokalemia - Mild. NAGMA - Mild.  P:   Monitoring UOP with Foley Trending renal function & electrolytes daily Replacing electrolytes as indicated Holding on KCl replacement  GASTROINTESTINAL A:   No acute issues.  P:   NPO Pepcid IV q12hr Holding on tube feedings  HEMATOLOGIC A:   Anemia - SAH w/ IVH.  P:  SCDs   INFECTIOUS A:   No acute issues.  P:   Monitoring for signs of infection.  ENDOCRINE A:   Hyperglycemia - No h/o DM. A1c 5.9. Hypothyroidism  - TSH 2.548.   P:   Synthroid 50mcg VT daily Accu-Checks q4hr SSI per Sensitive Algorithm   FAMILY  - Updates:  No family at bedside during my exam. Family updated 12/10 by Dr. Lovell SheehanJenkins (Neurosurg).  -  Inter-disciplinary family meet or Palliative Care meeting due by: 12/15  TODAY'S SUMMARY:  82 y.o. female w/ cad and nstemi in Nov 2017. Admitted w/ H&H grade 5 SAH, IVH and obstructive hydrocephalus. Intubated for airway protection. Now has intraventricular drain in place. Family reportedly contemplating terminal vent wean and extubation today with full comfort care. Awaiting final decision. Continuing ventilator support for now.  I have spent a total of 34 minutes of critical care time today caring for the patient, discussing plan of care with Neurosurgeon, and reviewing the patient's electronic medical record.  Donna ChristenJennings E. Jamison NeighborNestor, M.D. [facility] Pager:  [phone] After 3pm or if no response, call [phone] 10:13 AM 04/14/2016

## 2016-04-14 NOTE — Procedures (Signed)
Extubation Procedure Note  Patient Details:   Name: Chuck HintSally I Carlucci DOB: 12-11-1930 MRN: [id number]   Airway Documentation:     Evaluation  O2 sats: stable throughout Complications: No apparent complications Patient did tolerate procedure well. Bilateral Breath Sounds: Clear, Diminished   No   Pt extubated per Withdraw of Life protocol to Room Air. Pt stable throughout with no complications. RN and family at bedside. Pt unable to speak or cough post extubation. RT will continue to monitor.   Ermalinda BarriosKelley, Oneika Simonian M 04/14/2016, 4:09 PM

## 2016-04-14 NOTE — Progress Notes (Signed)
Patient ID: Alyssa Christensen, female   DOB: 07/06/1930, 82 y.o.   MRN: [id number] Subjective:  The patient is comatose without change. She is in no apparent distress. Her family is at the bedside.  Objective: Vital signs in last 24 hours: Temp:  [98 F (36.7 C)-99.7 F (37.6 C)] 99 F (37.2 C) (12/10 0800) Pulse Rate:  [80-110] 93 (12/10 0830) Resp:  [15-23] 18 (12/10 0830) BP: (114-175)/(51-102) 163/69 (12/10 0830) SpO2:  [98 %-100 %] 99 % (12/10 0830) FiO2 (%):  [30 %] 30 % (12/10 0800) Weight:  [66.6 kg (146 lb 13.2 oz)] 66.6 kg (146 lb 13.2 oz) (12/10 0400)  Intake/Output from previous day: 12/09 0701 - 12/10 0700 In: 3150 [I.V.:2400; IV Piggyback:100] Out: 1394 [Emesis/NG output:1225; Drains:169] Intake/Output this shift: Total I/O In: 100 [I.V.:100] Out: -   Physical exam Glascow coma scale 6, intubated, E1M4V1. The patient abnormally flexes to painful stimuli bilaterally. Her pupils are approximately 5 mm and nonreactive.  The patient's ventriculostomy stopped working this morning. I flushed it out with sterile preservative-free saline. It doesn't appear to be patent.  Lab Results:  Recent Labs  04/13/16 0306 04/14/16 0648  WBC 10.1 10.9*  HGB 10.7* 10.3*  HCT 31.6* 30.6*  PLT 168 137*   BMET  Recent Labs  04/13/16 0306 04/14/16 0648  NA 140 140  K 4.2 3.4*  CL 108 110  CO2 23 21*  GLUCOSE 142* 121*  BUN 15 11  CREATININE 0.95 0.97  CALCIUM 8.1* 8.0*    Studies/Results: Ct Angio Head W Or Wo Contrast  Result Date: 04/16/2016 CLINICAL DATA:  82 year old female with acute subarachnoid hemorrhage appearing aneurysmal related. Code stroke presentation. Initial encounter. EXAM: CT ANGIOGRAPHY HEAD TECHNIQUE: Multidetector CT imaging of the head was performed using the standard protocol during bolus administration of intravenous contrast. Multiplanar CT image reconstructions and MIPs were obtained to evaluate the vascular anatomy. CONTRAST:  50 mL Isovue 370  COMPARISON:  None. FINDINGS: CTA HEAD Posterior circulation: Patent but diminutive distal vertebral arteries and basilar artery. Fetal type left PCA origin. Normal right PCA origin. Right posterior communicating artery diminutive or absent. Right PICA, left AICA, and both SCA origins are patent. Anterior circulation: Patent in normal distal cervical ICAs. Both ICA siphons are patent. Calcified siphon atherosclerosis. No hemodynamically significant stenosis on either side. The left posterior communicating artery origin appears normal. Both ophthalmic artery origin regions appear normal. Both carotid termini appear normal. MCA and ACA origins are normal. Patent but diminutive A1 segments and visualized bilateral ACA branches. The anterior communicating artery appears diminutive and normal (series 7, image 98). Patent but somewhat diminutive bilateral MCA branches. Asymmetric early right MCA branching (series 11, image 17). Both M1 segments and MCA bifurcations otherwise appear within normal limits. No abnormal tangle of vessels identified anywhere to suggest AVM. Venous sinuses: Appear patent on the delayed phase image. Anatomic variants: Fetal type left PCA origin. Delayed phase: Large volume subarachnoid hemorrhage. Intraventricular hemorrhage with ventriculomegaly re- demonstrated. No abnormal enhancement identified. IMPRESSION: 1. No intracranial aneurysm identified on head CTA, but I suspect rupture from occult aneurysm as the source of the subarachnoid hemorrhage. This was discussed by telephone with Dr. Ritta Slot on 05/05/2016 at 10:57 . 2. Diffusely diminutive appearance of the anterior and posterior circulation likely due to subarachnoid hemorrhage related vasospasm. 3. Calcified ICA siphon atherosclerosis without stenosis. 4. Stable large volume subarachnoid hemorrhage, associated intraventricular hemorrhage, and ventriculomegaly. Electronically Signed   By: Althea Grimmer.D.  On: 04/20/2016 10:59    Portable Chest Xray  Result Date: 04/13/2016 CLINICAL DATA:  Subarachnoid hemorrhage EXAM: PORTABLE CHEST 1 VIEW COMPARISON:  04/17/2016 FINDINGS: Endotracheal tube in good position. NG tube has been placed with the tip in the stomach. Mild bibasilar atelectasis.  Negative for edema or effusion. IMPRESSION: Endotracheal tube in good position.  NG tube in the stomach Mild bibasilar atelectasis. Electronically Signed   By: Marlan Palauharles  Clark M.D.   On: 04/13/2016 06:57   Dg Chest Portable 1 View  Result Date: 04/23/2016 CLINICAL DATA:  Endotracheal tube placement. EXAM: PORTABLE CHEST 1 VIEW COMPARISON:  Radiograph of March 19, 2016. FINDINGS: Stable cardiomediastinal silhouette. Atherosclerosis of thoracic aorta is noted. Interval placement of endotracheal tube with distal tip 5 cm above the carina. No pneumothorax or pleural effusion is noted. Bony thorax is unremarkable. IMPRESSION: Endotracheal tube in grossly good position. No acute cardiopulmonary abnormality seen. Aortic atherosclerosis. Electronically Signed   By: Lupita RaiderJames  Green Jr, M.D.   On: 04/29/2016 10:26   Ct Head Code Stroke W/o Cm  Result Date: 05/05/2016 CLINICAL DATA:  Code stroke. 82 year old female with headache. Initial encounter. EXAM: CT HEAD WITHOUT CONTRAST TECHNIQUE: Contiguous axial images were obtained from the base of the skull through the vertex without intravenous contrast. COMPARISON:  None. FINDINGS: Brain: Relatively large volume of subarachnoid hemorrhage throughout the basilar cisterns. Fairly symmetric distribution. Intraventricular hemorrhage in the third and fourth ventricles mostly, small volume of lateral intraventricular hemorrhage near the foramina of [place]. There might also be a small volume of intraventricular hemorrhage in the right occipital horn. Ventriculomegaly with evidence of transependymal edema about the atria and occipital horns. No superimposed acute parenchymal hemorrhage or acute cortically based  infarct identified. No midline shift. There is an area of midline CSF preserved about the cerebellar vermis. This might be related to mega cisterna magna variant or a small arachnoid cyst. Vascular: Calcified atherosclerosis at the skull base. Major vascular structures otherwise obscured by large volume subarachnoid hemorrhage. Skull: No acute or suspicious bone lesion identified. No skull fracture identified. Sinuses/Orbits: Clear. Other: Disc conjugate gaze but otherwise negative orbit soft tissues. No scalp hematoma or acute appearing scalp soft tissue finding. ASPECTS [facility]([place] Stroke Program Early CT Score) Total score (0-10 with 10 being normal): Not applicable, intracranial hemorrhage. IMPRESSION: 1. Relatively large volume subarachnoid hemorrhage throughout the bilateral basilar cisterns. The pattern is indicative of acute rupture of an intracranial aneurysm. 2. Moderate volume intraventricular hemorrhage with evidence of acute ventriculomegaly. 3. ASPECTS is not applicable; acute intracranial hemorrhage. 4. The above was relayed via text pager to Dr. Ritta SlotMcNeill Kirkpatrick on 05/02/2016 at 10:40 . Electronically Signed   By: Odessa FlemingH  Hall M.D.   On: 04/14/2016 10:41    Assessment/Plan: Nontraumatic subarachnoid hemorrhage, intraventricular hemorrhage, hydrocephalus: I had a long discussion with the patient's 2 sons and 2 daughters. They have seen her head CT. I do explain that with her age and presenting neurologic status the prognosis is poor. We have discussed the various treatment options including continued supportive care, replacement of her nonfunctioning ventriculostomy, etc. we've also discussed the option of withdrawal of support. They have told me that she has made it clear that she would not want to be on "life support" under these circumstances. They are leaning heavily towards withdrawal of support/extubation but want a little more time to think about it/discuss it. This seems reasonable. I have  answered all their questions. We will plan to continue with supportive care until they give us  their final decision.  LOS: 2 days     Claire Dolores D 04/14/2016, 10:18 AM

## 2016-04-14 NOTE — Plan of Care (Signed)
  Interdisciplinary Goals of Care Family Meeting   Date carried out:: 04/14/2016  Location of the meeting: Bedside  Member's involved: Physician and Family Member or next of kin  Durable Power of Attorney or Environmental health practitioneracting medical decision maker: Children    Discussion: We discussed goals of care for Alyssa HintSally I Montalvo .  Family wish to proceed with full comfort care at this time. I explained the process of terminal extubation & palliation of symptoms to prevent any pain or dyspnea. Ordering Morphine drip & Atropine sublingual. Offered hospital chaplain but they declined. Plan to remove ventriculostomy drain with extubation.   Code status: Full DNR  Disposition: In-patient comfort care  Time spent for the meeting: 7 minutes  Lawanda CousinsJennings Jahnia Hewes 04/14/2016, 3:05 PM

## 2016-04-14 NOTE — Progress Notes (Signed)
Changed ett tube holder per RN request. Pt stable throughout with no complications. RT will continue to monitor.

## 2016-04-16 ENCOUNTER — Encounter (HOSPITAL_COMMUNITY): Payer: Self-pay | Admitting: *Deleted

## 2016-04-19 ENCOUNTER — Ambulatory Visit
Admission: RE | Admit: 2016-04-19 | Payer: Medicare Other | Source: Ambulatory Visit | Admitting: Obstetrics and Gynecology

## 2016-04-19 ENCOUNTER — Encounter: Admission: RE | Payer: Self-pay | Source: Ambulatory Visit

## 2016-04-19 SURGERY — COLPORRHAPHY, ANTERIOR, FOR CYSTOCELE REPAIR
Anesthesia: Choice

## 2016-05-06 NOTE — Progress Notes (Signed)
Pt being transferred to 6N at this time.  Report called to 6N RN and family updated.  Pt's daughter, Dondra SpryGail, stated that they did not want their mother's dentures and to just throw them away.  Will continue to provide support for the family and comfort for the pt.

## 2016-05-06 NOTE — Care Management Note (Signed)
Case Management Note  Patient Details  Name: Alyssa Christensen MRN: [id number] Date of Birth: Jan 09, 1931  Subjective/Objective: Pt admitted on 04/21/2016 with SAH, IVH and hydrocephalus.   On 04/14/16, pt was made a DNR and transitioned to comfort care by family.                     Action/Plan: Met with pt/family to offer emotional support.  Pt appears comfortable, surrounded by her family members.  Family appreciated my visit.    Expected Discharge Date:                  Expected Discharge Plan:     In-House Referral:  Clinical Social Work  Discharge planning Services  CM Consult  Post Acute Care Choice:    Choice offered to:     DME Arranged:    DME Agency:     HH Arranged:    HH Agency:     Status of Service:  In process, will continue to follow  If discussed at Long Length of Stay Meetings, dates discussed:    Additional Comments:  Reinaldo Raddle, RN, BSN  Trauma/Neuro ICU Case Manager [phone]

## 2016-05-06 NOTE — Progress Notes (Signed)
Received patient from 63M. Patient is unresponsive , full comfort care, on morphine drip. Family at bedside, oriented about the unit and staff. Will continue to monitor.

## 2016-05-06 NOTE — Plan of Care (Signed)
Problem: Education: Goal: Knowledge of secondary prevention will improve Outcome: Not Applicable Date Met: 45/40/98 Pt on comfort care. Goal: Knowledge of patient specific risk factors addressed and post discharge goals established will improve Outcome: Not Applicable Date Met: 11/91/47 Pt on comfort care.

## 2016-05-06 NOTE — Discharge Summary (Signed)
Chuck HintSally I Tiberio was a 83 yo female who presented with severe HA and chest pain that rapidly progressed to unresponsiveness.  She was found to have SAH, IVH, hydrocephalus.  She required intubation.  She was seen by neurology and neurosurgery.  She had Rt frontal ventriculostomy placed.  No improvement in mental status.  Family did not want her to suffer and opted for DNR status with transition to comfort care.  She was extubated 12/10.  She subsequently expired on 04/25/2016.  Final Diagnoses: Acute encephalopathy with coma SAH IVH Hydrocephalus Acute respiratory failure with hypoxia Hx of CAD AKI Hypokalemia Non anion gap metabolic acidosis Anemia of critical illness Thrombocytopenia Hx of hypothyroidism  Coralyn HellingVineet Tyeasha Ebbs, MD [facility] 04/16/2016, 2:18 PM

## 2016-05-06 NOTE — Progress Notes (Signed)
Upon rounds, noted patient was not breathing, no pulse rate noted as well. MD made aware of the death. Family notified as well. [facility] and body is not suitable for any organ donation due to age.

## 2016-05-06 NOTE — Progress Notes (Signed)
PCCM Progress Note  Admission date: 04/27/2016 Referring provider: Dr. Lovell SheehanJenkins  CC: Headache  HPI: 83 yo female presented with severe HA and chest pain that rapidly progressed to unresponsiveness.  She was found to have SAH, IVH, hydrocephalus.  She required intubation.  She was seen by neurology and neurosurgery.  She had Rt frontal ventriculostomy placed.  No improvement in mental status.  Family did not want her to suffer and opted for DNR status with transition to comfort care.  She was extubated 12/10.  Subjective: Appears comfortable.  Vital signs: BP (!) 165/72   Pulse 66   Temp 99 F (37.2 C) (Axillary)   Resp 13   Ht 5\' 6"  (1.676 m)   Wt 146 lb 13.2 oz (66.6 kg)   SpO2 (!) 84%   BMI 23.70 kg/m   General: comatose Neuro: not following commands HEENT: no stridor Cardiac: regular Chest: shallow breathing Abd: soft Ext: 1+ edema  Assessment: Acute encephalopathy with coma SAH IVH Hydrocephalus Acute respiratory failure with hypoxia Hx of CAD AKI Hypokalemia Non anion gap metabolic acidosis Anemia of critical illness Thrombocytopenia Hx of hypothyroidism  Plan: Transfer to floor bed  Morphine, ativan  Updated pt's family at bedside  Coralyn HellingVineet Darrian Goodwill, MD [facility] 04/27/2016, 12:06 PM Pager:  [phone] After 3pm call: [phone]

## 2016-05-06 DEATH — deceased

## 2016-07-03 ENCOUNTER — Ambulatory Visit: Payer: BLUE CROSS/BLUE SHIELD | Admitting: Internal Medicine

## 2017-11-24 IMAGING — DX DG CHEST 1V PORT
1 series · 1 of 1 positions shown · non-contrast
Comparison: Radiograph March 19, 2016.

CLINICAL DATA: Endotracheal tube placement.

EXAM:
PORTABLE CHEST 1 VIEW

[chest ap]
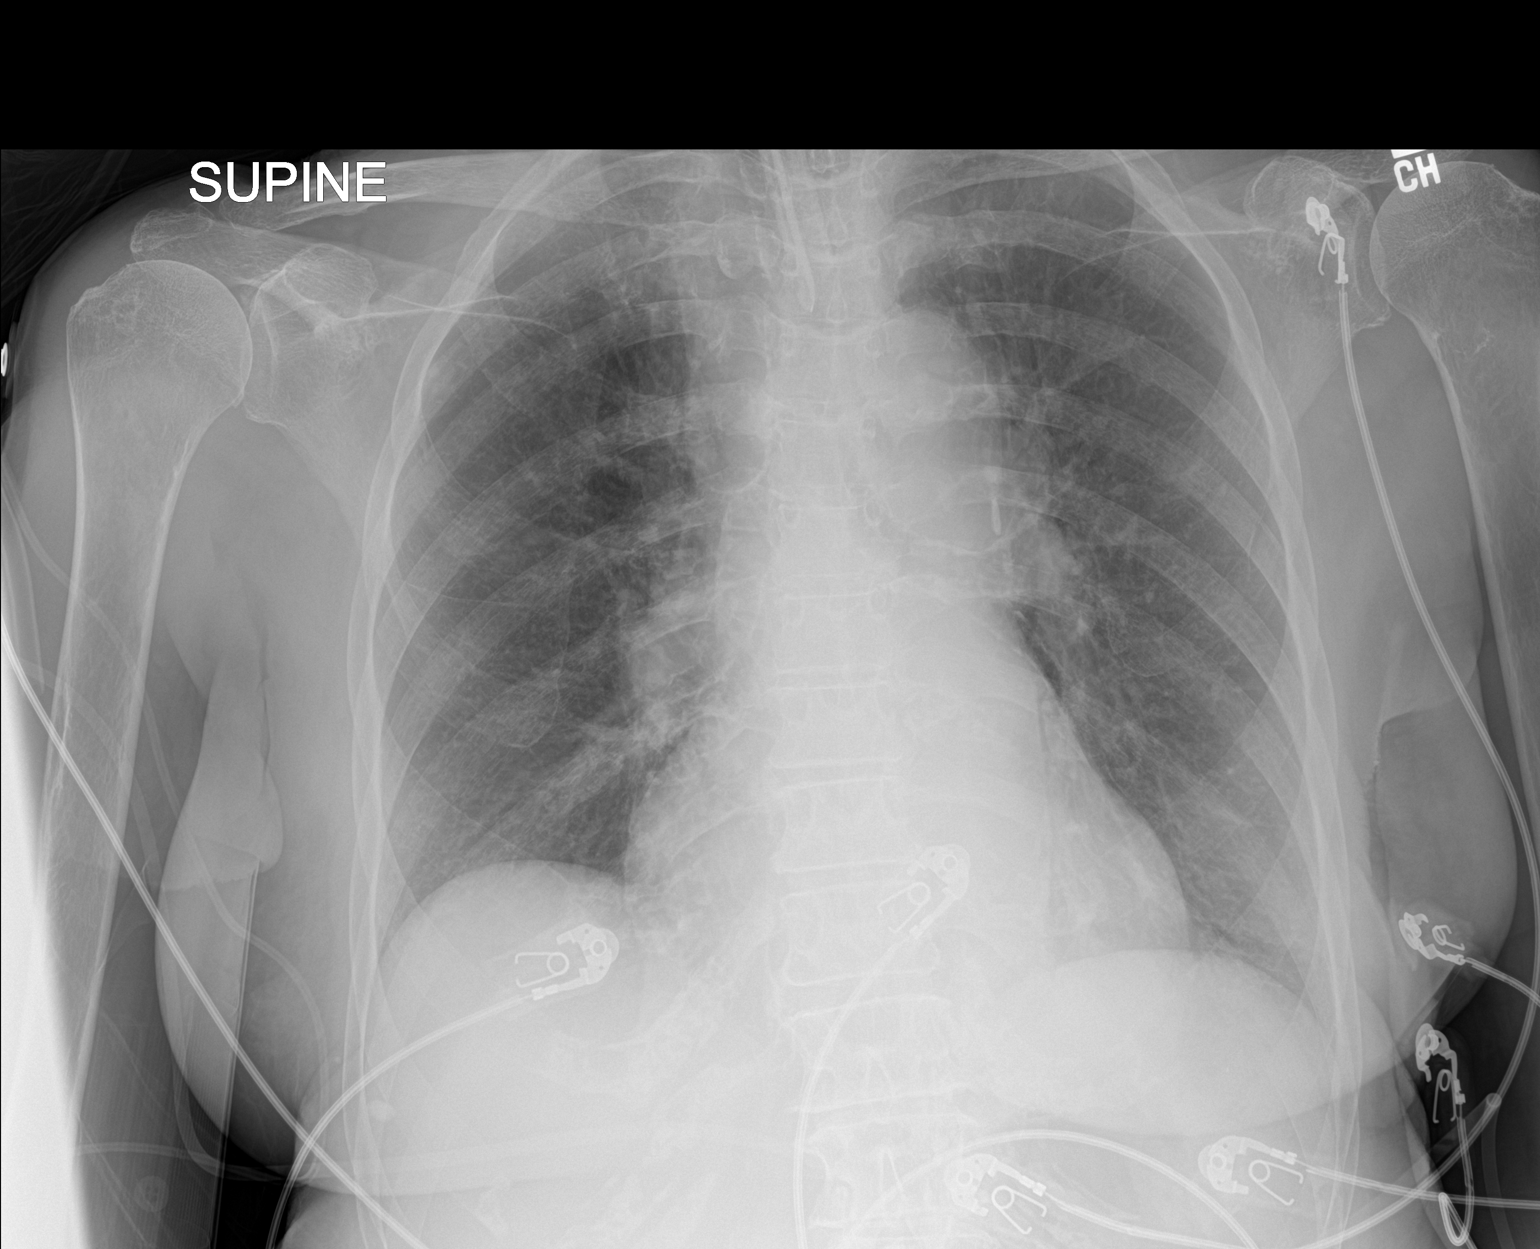

[1 of 1 positions shown; findings below may reference images not displayed]

FINDINGS: Stable cardiomediastinal silhouette. Atherosclerosis of thoracic
aorta is noted. Interval placement of endotracheal tube with distal
tip 5 cm above the carina. No pneumothorax or pleural effusion is
noted. Bony thorax is unremarkable.
IMPRESSION: Endotracheal tube in grossly good position. No acute cardiopulmonary
abnormality seen. Aortic atherosclerosis.

## 2017-11-24 IMAGING — CT CT ANGIO HEAD
1 of 8 series · 6 of 47 positions shown · IV contrast (Omni 300)
Comparison: None.

CLINICAL DATA: 85-year-old female with acute subarachnoid
hemorrhage appearing aneurysmal related. Code stroke presentation.
Initial encounter.

EXAM:
CT ANGIOGRAPHY HEAD
TECHNIQUE: Multidetector CT imaging of the head was performed using the
standard protocol during bolus administration of intravenous
contrast. Multiplanar CT image reconstructions and MIPs were
obtained to evaluate the vascular anatomy.
CONTRAST:  50 mL Isovue 370

[Series 5: cow 2.0 · axial · 0.53mm/px · z∈[-150,-38]mm · 6 of 80 slices shown]
[im 12/80  brain]
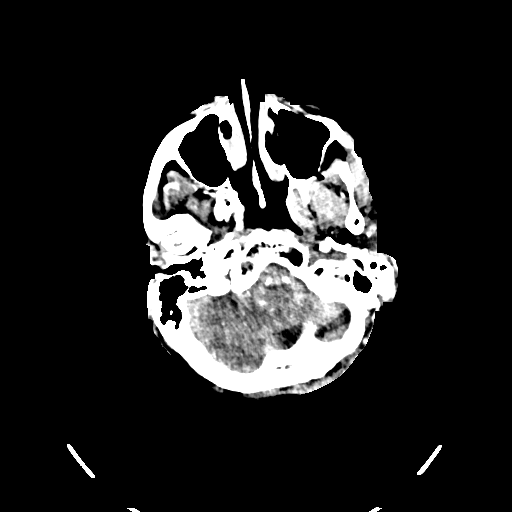
[im 23/80  bone]
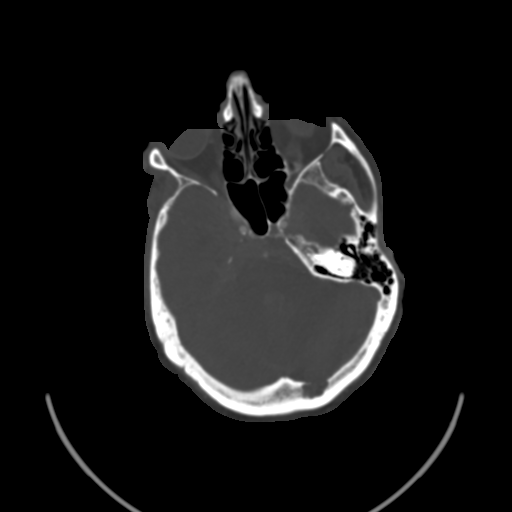
[im 34/80  brain]
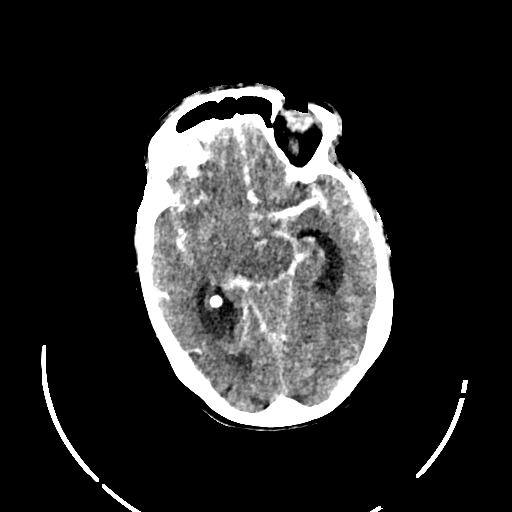
[im 46/80  bone]
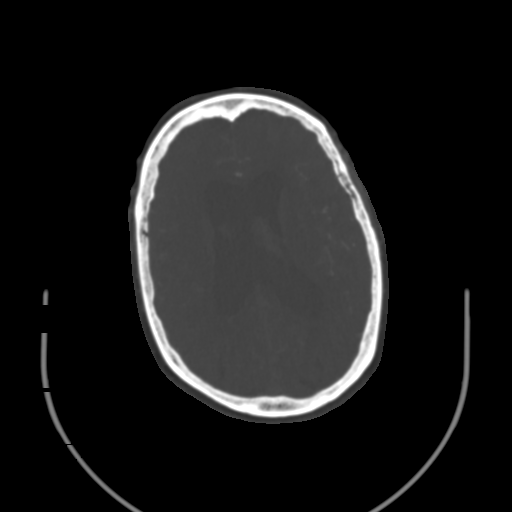
[im 57/80  brain]
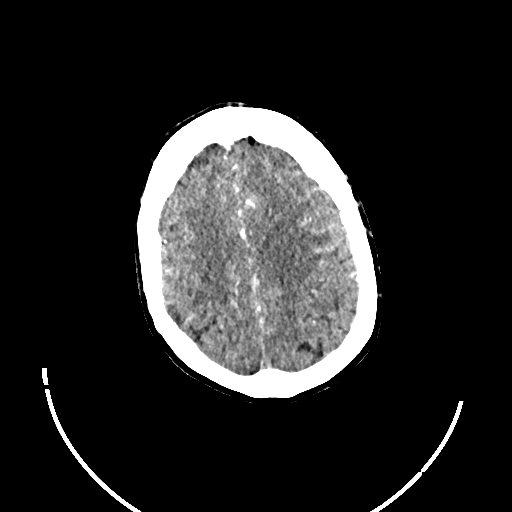
[im 68/80  bone]
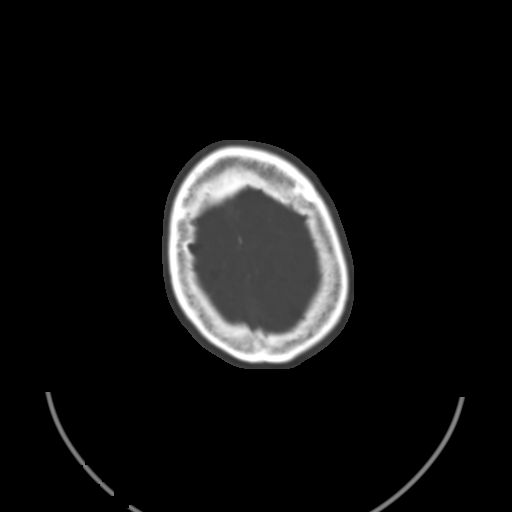

[6 of 47 positions shown; findings below may reference images not displayed]

FINDINGS: CTA HEAD

Posterior circulation: Patent but diminutive distal vertebral
arteries and basilar artery. Fetal type left PCA origin. Normal
right PCA origin. Right posterior communicating artery diminutive or
absent. Right PICA, left AICA, and both SCA origins are patent.

Anterior circulation: Patent in normal distal cervical ICAs. Both
ICA siphons are patent. Calcified siphon atherosclerosis. No
hemodynamically significant stenosis on either side. The left
posterior communicating artery origin appears normal. Both
ophthalmic artery origin regions appear normal. Both carotid termini
appear normal. MCA and ACA origins are normal. Patent but diminutive
A1 segments and visualized bilateral ACA branches. The anterior
communicating artery appears diminutive and normal (series 7, image
98). Patent but somewhat diminutive bilateral MCA branches.
Asymmetric early right MCA branching (series 11, image 17). Both M1
segments and MCA bifurcations otherwise appear within normal limits.

No abnormal tangle of vessels identified anywhere to suggest AVM.

Venous sinuses: Appear patent on the delayed phase image.

Anatomic variants: Fetal type left PCA origin.

Delayed phase: Large volume subarachnoid hemorrhage.
Intraventricular hemorrhage with ventriculomegaly re- demonstrated.
No abnormal enhancement identified.
IMPRESSION: 1. No intracranial aneurysm identified on head CTA, but I suspect
rupture from occult aneurysm as the source of the subarachnoid
hemorrhage. This was discussed by telephone with Dr. Peremne
Umu on 04/12/2016 at [DATE] .
2. Diffusely diminutive appearance of the anterior and posterior
circulation likely due to subarachnoid hemorrhage related vasospasm.
3. Calcified ICA siphon atherosclerosis without stenosis.
4. Stable large volume subarachnoid hemorrhage, associated
intraventricular hemorrhage, and ventriculomegaly.
